# Patient Record
Sex: Female | Born: 1990 | Hispanic: Yes | Marital: Single | Smoking: Current some day smoker
Health system: Southern US, Community
[De-identification: ages and names within clinical notes are randomized; demographics above are authoritative.]

## PROBLEM LIST (undated history)

## (undated) DIAGNOSIS — F419 Anxiety disorder, unspecified: Secondary | ICD-10-CM

## (undated) DIAGNOSIS — F319 Bipolar disorder, unspecified: Secondary | ICD-10-CM

## (undated) DIAGNOSIS — M419 Scoliosis, unspecified: Secondary | ICD-10-CM

## (undated) DIAGNOSIS — J45909 Unspecified asthma, uncomplicated: Secondary | ICD-10-CM

## (undated) DIAGNOSIS — M549 Dorsalgia, unspecified: Secondary | ICD-10-CM

## (undated) DIAGNOSIS — D649 Anemia, unspecified: Secondary | ICD-10-CM

## (undated) HISTORY — DX: Bipolar disorder, unspecified: F31.9

---
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
## 2010-03-01 ENCOUNTER — Ambulatory Visit: Payer: Self-pay | Admitting: Family Medicine

## 2010-08-01 ENCOUNTER — Inpatient Hospital Stay: Payer: Self-pay | Admitting: Obstetrics and Gynecology

## 2011-05-25 ENCOUNTER — Ambulatory Visit: Payer: Self-pay | Admitting: Family Medicine

## 2011-06-04 ENCOUNTER — Encounter: Payer: Self-pay | Admitting: Family Medicine

## 2011-06-17 ENCOUNTER — Emergency Department: Payer: Self-pay | Admitting: Emergency Medicine

## 2011-07-02 ENCOUNTER — Encounter: Payer: Self-pay | Admitting: Family Medicine

## 2012-01-10 LAB — HM PAP SMEAR

## 2012-02-24 ENCOUNTER — Emergency Department: Payer: Self-pay | Admitting: Emergency Medicine

## 2012-02-24 LAB — CBC
HCT: 38.4 % (ref 35.0–47.0)
HGB: 11.9 g/dL — ABNORMAL LOW (ref 12.0–16.0)
MCH: 26 pg (ref 26.0–34.0)
MCHC: 31 g/dL — ABNORMAL LOW (ref 32.0–36.0)
MCV: 84 fL (ref 80–100)
Platelet: 165 10*3/uL (ref 150–440)
RBC: 4.58 10*6/uL (ref 3.80–5.20)
RDW: 12.6 % (ref 11.5–14.5)
WBC: 15.1 10*3/uL — ABNORMAL HIGH (ref 3.6–11.0)

## 2012-02-24 LAB — COMPREHENSIVE METABOLIC PANEL
Albumin: 3.9 g/dL (ref 3.4–5.0)
Alkaline Phosphatase: 58 U/L (ref 50–136)
Anion Gap: 6 — ABNORMAL LOW (ref 7–16)
BUN: 10 mg/dL (ref 7–18)
Bilirubin,Total: 0.6 mg/dL (ref 0.2–1.0)
Calcium, Total: 8.5 mg/dL (ref 8.5–10.1)
Chloride: 106 mmol/L (ref 98–107)
Co2: 27 mmol/L (ref 21–32)
Creatinine: 0.76 mg/dL (ref 0.60–1.30)
EGFR (African American): >60
EGFR (Non-African Amer.): >60
Glucose: 88 mg/dL (ref 65–99)
Osmolality: 276 (ref 275–301)
Potassium: 3.3 mmol/L — ABNORMAL LOW (ref 3.5–5.1)
SGOT(AST): 18 U/L (ref 15–37)
SGPT (ALT): 13 U/L (ref 12–78)
Sodium: 139 mmol/L (ref 136–145)
Total Protein: 7.9 g/dL (ref 6.4–8.2)

## 2012-02-24 LAB — URINALYSIS, COMPLETE
Bacteria: NONE SEEN
Bilirubin,UR: NEGATIVE
Glucose,UR: NEGATIVE mg/dL (ref 0–75)
Ketone: NEGATIVE
Leukocyte Esterase: NEGATIVE
Nitrite: NEGATIVE
Ph: 5 (ref 4.5–8.0)
Protein: NEGATIVE
RBC,UR: 1 /HPF (ref 0–5)
Specific Gravity: 1.03 (ref 1.003–1.030)
Squamous Epithelial: 14
WBC UR: 1 /HPF (ref 0–5)

## 2012-02-24 LAB — PREGNANCY, URINE: Pregnancy Test, Urine: NEGATIVE m[IU]/mL

## 2012-10-03 ENCOUNTER — Emergency Department: Payer: Self-pay | Admitting: Emergency Medicine

## 2012-10-03 LAB — URINALYSIS, COMPLETE
Bacteria: NONE SEEN
Bilirubin,UR: NEGATIVE
Blood: NEGATIVE
Glucose,UR: NEGATIVE mg/dL (ref 0–75)
Leukocyte Esterase: NEGATIVE
Nitrite: NEGATIVE
Ph: 7 (ref 4.5–8.0)
Protein: NEGATIVE
RBC,UR: 1 /HPF (ref 0–5)
Specific Gravity: 1.019 (ref 1.003–1.030)
Squamous Epithelial: <1
WBC UR: 1 /HPF (ref 0–5)

## 2012-10-03 LAB — CBC
HCT: 34.8 % — ABNORMAL LOW (ref 35.0–47.0)
HGB: 11.6 g/dL — ABNORMAL LOW (ref 12.0–16.0)
MCH: 27.9 pg (ref 26.0–34.0)
MCHC: 33.3 g/dL (ref 32.0–36.0)
MCV: 84 fL (ref 80–100)
Platelet: 126 10*3/uL — ABNORMAL LOW (ref 150–440)
RBC: 4.15 10*6/uL (ref 3.80–5.20)
RDW: 13.2 % (ref 11.5–14.5)
WBC: 8.5 10*3/uL (ref 3.6–11.0)

## 2012-10-03 LAB — COMPREHENSIVE METABOLIC PANEL
Albumin: 3.3 g/dL — ABNORMAL LOW (ref 3.4–5.0)
Alkaline Phosphatase: 36 U/L — ABNORMAL LOW (ref 50–136)
Anion Gap: 4 — ABNORMAL LOW (ref 7–16)
BUN: 8 mg/dL (ref 7–18)
Bilirubin,Total: 0.2 mg/dL (ref 0.2–1.0)
Calcium, Total: 8.5 mg/dL (ref 8.5–10.1)
Chloride: 106 mmol/L (ref 98–107)
Co2: 26 mmol/L (ref 21–32)
Creatinine: 0.55 mg/dL — ABNORMAL LOW (ref 0.60–1.30)
EGFR (African American): >60
EGFR (Non-African Amer.): >60
Glucose: 82 mg/dL (ref 65–99)
Osmolality: 269 (ref 275–301)
Potassium: 3.3 mmol/L — ABNORMAL LOW (ref 3.5–5.1)
SGOT(AST): 19 U/L (ref 15–37)
SGPT (ALT): 10 U/L — ABNORMAL LOW (ref 12–78)
Sodium: 136 mmol/L (ref 136–145)
Total Protein: 7.1 g/dL (ref 6.4–8.2)

## 2012-10-03 LAB — HCG, QUANTITATIVE, PREGNANCY: Beta Hcg, Quant.: 19132 m[IU]/mL — ABNORMAL HIGH

## 2012-10-23 ENCOUNTER — Emergency Department: Payer: Self-pay | Admitting: Emergency Medicine

## 2012-10-23 LAB — CBC
HCT: 34.4 % — ABNORMAL LOW (ref 35.0–47.0)
HGB: 11.5 g/dL — ABNORMAL LOW (ref 12.0–16.0)
MCH: 28.1 pg (ref 26.0–34.0)
MCHC: 33.3 g/dL (ref 32.0–36.0)
MCV: 84 fL (ref 80–100)
Platelet: 129 10*3/uL — ABNORMAL LOW (ref 150–440)
RBC: 4.08 10*6/uL (ref 3.80–5.20)
RDW: 13.1 % (ref 11.5–14.5)
WBC: 10.7 10*3/uL (ref 3.6–11.0)

## 2012-10-23 LAB — HCG, QUANTITATIVE, PREGNANCY: Beta Hcg, Quant.: 9922 m[IU]/mL — ABNORMAL HIGH

## 2012-10-23 LAB — URINALYSIS, COMPLETE
Bacteria: NONE SEEN
Bilirubin,UR: NEGATIVE
Glucose,UR: NEGATIVE mg/dL (ref 0–75)
Ketone: NEGATIVE
Nitrite: NEGATIVE
Ph: 5 (ref 4.5–8.0)
Protein: 30
RBC,UR: 11 /HPF (ref 0–5)
Specific Gravity: 1.021 (ref 1.003–1.030)
Squamous Epithelial: 3
WBC UR: 140 /HPF (ref 0–5)

## 2013-01-02 ENCOUNTER — Emergency Department: Payer: Self-pay | Admitting: Emergency Medicine

## 2013-01-02 LAB — COMPREHENSIVE METABOLIC PANEL
Albumin: 3 g/dL — ABNORMAL LOW (ref 3.4–5.0)
Alkaline Phosphatase: 67 U/L
Anion Gap: 3 — ABNORMAL LOW (ref 7–16)
BUN: 7 mg/dL (ref 7–18)
Bilirubin,Total: 0.2 mg/dL (ref 0.2–1.0)
Calcium, Total: 8.6 mg/dL (ref 8.5–10.1)
Chloride: 105 mmol/L (ref 98–107)
Co2: 27 mmol/L (ref 21–32)
Creatinine: 0.59 mg/dL — ABNORMAL LOW (ref 0.60–1.30)
EGFR (African American): >60
EGFR (Non-African Amer.): >60
Glucose: 87 mg/dL (ref 65–99)
Osmolality: 267 (ref 275–301)
Potassium: 3.5 mmol/L (ref 3.5–5.1)
SGOT(AST): 28 U/L (ref 15–37)
SGPT (ALT): 13 U/L (ref 12–78)
Sodium: 135 mmol/L — ABNORMAL LOW (ref 136–145)
Total Protein: 7.1 g/dL (ref 6.4–8.2)

## 2013-01-02 LAB — CBC WITH DIFFERENTIAL/PLATELET
Basophil #: 0 10*3/uL (ref 0.0–0.1)
Basophil %: 0.4 %
Eosinophil #: 0 10*3/uL (ref 0.0–0.7)
Eosinophil %: 0.7 %
HCT: 36.1 % (ref 35.0–47.0)
HGB: 11.8 g/dL — ABNORMAL LOW (ref 12.0–16.0)
Lymphocyte #: 1.2 10*3/uL (ref 1.0–3.6)
Lymphocyte %: 17.5 %
MCH: 27.3 pg (ref 26.0–34.0)
MCHC: 32.6 g/dL (ref 32.0–36.0)
MCV: 84 fL (ref 80–100)
Monocyte #: 0.6 x10 3/mm (ref 0.2–0.9)
Monocyte %: 9.3 %
Neutrophil #: 4.8 10*3/uL (ref 1.4–6.5)
Neutrophil %: 72.1 %
Platelet: 140 10*3/uL — ABNORMAL LOW (ref 150–440)
RBC: 4.31 10*6/uL (ref 3.80–5.20)
RDW: 13.6 % (ref 11.5–14.5)
WBC: 6.6 10*3/uL (ref 3.6–11.0)

## 2013-01-02 LAB — URINALYSIS, COMPLETE
Bacteria: NONE SEEN
Bilirubin,UR: NEGATIVE
Blood: NEGATIVE
Glucose,UR: NEGATIVE mg/dL (ref 0–75)
Nitrite: NEGATIVE
Ph: 7 (ref 4.5–8.0)
Protein: 30
RBC,UR: 1 /HPF (ref 0–5)
Specific Gravity: 1.025 (ref 1.003–1.030)
Squamous Epithelial: 5
WBC UR: 5 /HPF (ref 0–5)

## 2013-01-02 LAB — WET PREP, GENITAL

## 2013-01-02 LAB — GC/CHLAMYDIA PROBE AMP

## 2013-01-05 LAB — URINE CULTURE

## 2013-02-27 ENCOUNTER — Observation Stay: Payer: Self-pay

## 2013-03-13 ENCOUNTER — Observation Stay: Payer: Self-pay | Admitting: Obstetrics and Gynecology

## 2013-03-13 LAB — PLATELET COUNT: Platelet: 110 10*3/uL — ABNORMAL LOW (ref 150–440)

## 2013-03-14 ENCOUNTER — Inpatient Hospital Stay: Payer: Self-pay

## 2013-03-14 LAB — CBC WITH DIFFERENTIAL/PLATELET
Basophil #: 0 10*3/uL (ref 0.0–0.1)
Basophil %: 0.2 %
Eosinophil #: 0 10*3/uL (ref 0.0–0.7)
Eosinophil %: 0.1 %
HCT: 37.6 % (ref 35.0–47.0)
HGB: 11.9 g/dL — ABNORMAL LOW (ref 12.0–16.0)
Lymphocyte #: 1.1 10*3/uL (ref 1.0–3.6)
Lymphocyte %: 10.1 %
MCH: 27 pg (ref 26.0–34.0)
MCHC: 31.7 g/dL — ABNORMAL LOW (ref 32.0–36.0)
MCV: 85 fL (ref 80–100)
Monocyte #: 0.6 x10 3/mm (ref 0.2–0.9)
Monocyte %: 5.3 %
Neutrophil #: 9 10*3/uL — ABNORMAL HIGH (ref 1.4–6.5)
Neutrophil %: 84.3 %
Platelet: 116 10*3/uL — ABNORMAL LOW (ref 150–440)
RBC: 4.41 10*6/uL (ref 3.80–5.20)
RDW: 13.8 % (ref 11.5–14.5)
WBC: 10.6 10*3/uL (ref 3.6–11.0)

## 2013-03-16 LAB — HEMATOCRIT: HCT: 31.8 % — ABNORMAL LOW (ref 35.0–47.0)

## 2013-06-12 ENCOUNTER — Emergency Department: Payer: Self-pay | Admitting: Emergency Medicine

## 2013-06-24 ENCOUNTER — Emergency Department: Payer: Self-pay | Admitting: Emergency Medicine

## 2014-04-24 NOTE — Op Note (Signed)
PATIENT NAME:  Carolyn Dawson, Carolyn Dawson MR#:  [id number] DATE OF BIRTH:  1990-10-08  DATE OF PROCEDURE:  03/15/2013  PREOPERATIVE DIAGNOSES:  71. A 24 year old gravida 2, para 1-0-0-1, at 39 weeks and 6 days.  2. Failure to progress.   POSTOPERATIVE DIAGNOSES:  53. A 24 year old gravida 2, para 1-0-0-1, at 39 weeks and 6 days.  2. Failure to progress.   OPERATION PERFORMED: Primary low transverse cesarean section via Pfannenstiel skin incision.   ANESTHESIA USED: Spinal.   PRIMARY SURGEON: Florina Ou. Bonney Aid, M.D.   ASSISTANT: Elizebeth Brooking, Surgical Tech.   ESTIMATED BLOOD LOSS: 700 mL.   OPERATIVE FLUIDS: 1600 mL of crystalloid.   URINE OUTPUT: 150 mL.   DRAINS OR TUBES: Foley to gravity drainage. ON-Q catheter system.   IMPLANTS: None.   COMPLICATIONS: None.   INTRAOPERATIVE FINDINGS: Normal tubes, ovaries and uterus. Delivery resulted in the birth of a liveborn female infant weighing 3450 grams, 7 pounds 10 ounces, Apgars 8 and 9.   SPECIMENS REMOVED: None.  PATIENT CONDITION FOLLOWING PROCEDURE: Stable.   PROCEDURE IN DETAIL: Risks, benefits and alternatives of the procedure were discussed with the patient prior to proceeding to the operating room. The patient was taken to the operating room where she was placed under spinal anesthesia. She was prepped and draped in the usual sterile fashion. A timeout procedure was performed, and the level of anesthetic was checked prior to proceeding with the case. A Pfannenstiel skin incision was made 2 cm above the pubic symphysis, carried down sharply to the level of the rectus fascia using the knife. The fascia was incised in the midline using the scalpel, and then the fascial incision was extended using Mayo scissors. The superior border of the rectus fascia was grasped with 2 Kocher clamps. The underlying rectus muscles were dissected off the rectus fascia using blunt dissection. Median raphe was incised using Mayo scissors. The inferior border of  the rectus fascia was dissected off the rectus muscle in a similar fashion. The midline was identified. The peritoneum was entered bluntly, and the peritoneal incision was extended using manual traction. A bladder blade was placed. A bladder flap was then created using Metzenbaum scissors, further developed digitally before replacing the bladder blade to displace the bladder caudally. A low transverse hysterotomy incision was scored on the uterus using the knife. The hysterotomy was entered bluntly using the operator's finger and then extended using manual traction. Upon placing the operator's hand into the hysterotomy incision, the fetus was noted to be in the OA position. The vertex was grasped, flexed, brought to the incision, delivered atraumatically using fundal pressure. The infant was suctioned. Cord was clamped and cut, and the infant was passed to the awaiting pediatricians. The placenta was delivered using manual extraction. The uterus was exteriorized, wiped clean of clots and debris using 2 moist laps. The uterine incision was then closed using a 2 layer closure with the first being a running locked, the second a vertical imbricating. The uterus was returned to the abdomen. The hysterotomy incision was reinspected and noted to be hemostatic. The muscle was reapproximated in the midline using a 2-0 Vicryl mattress stitch. The ON-Q catheters were then placed subfascially per the usual protocol. Fascia was closed using a looped #1 PDS in a running fashion. The subcutaneous tissue was irrigated. Hemostasis was achieved using the Bovie. Skin was closed using staples. Each ON-Q catheter was then bolused with 5 mL of 0.5% bupivacaine each. Sponge, needle and instrument counts were correct  x 2. The patient tolerated the procedure well and was taken to the recovery room in stable condition.   ____________________________ Florina OuAndreas M. Bonney AidStaebler, MD ams:gb D: 03/17/2013 20:34:13 ET T: 03/18/2013 02:50:51  ET JOB#: [id number]  cc: Florina OuAndreas M. Bonney AidStaebler, MD, <Dictator> Carmel SacramentoANDREAS Cathrine MusterM Pryor Guettler MD ELECTRONICALLY SIGNED 03/24/2013 12:43

## 2014-05-03 ENCOUNTER — Emergency Department
Admission: EM | Admit: 2014-05-03 | Discharge: 2014-05-03 | Disposition: A | Discharge status: Home / Self Care | Payer: Medicaid Other | Attending: Emergency Medicine | Admitting: Emergency Medicine

## 2014-05-03 ENCOUNTER — Encounter: Payer: Self-pay | Admitting: Emergency Medicine

## 2014-05-03 DIAGNOSIS — Z720 Tobacco use: Secondary | ICD-10-CM | POA: Insufficient documentation

## 2014-05-03 DIAGNOSIS — R21 Rash and other nonspecific skin eruption: Secondary | ICD-10-CM | POA: Diagnosis present

## 2014-05-03 DIAGNOSIS — B86 Scabies: Secondary | ICD-10-CM | POA: Diagnosis not present

## 2014-05-03 MED ORDER — PERMETHRIN 5 % EX CREA: TOPICAL_CREAM | CUTANEOUS | Status: AC

## 2014-05-03 NOTE — Discharge Instructions (Signed)

## 2014-05-03 NOTE — ED Notes (Signed)
Pt reports that they have scabies

## 2014-05-03 NOTE — ED Notes (Signed)
States she has been treated for scabies in past   But rash and itching noted

## 2014-05-05 NOTE — ED Provider Notes (Signed)
[facility] Emergency Department Provider Note  ____________________________________________  Time seen:8:33 AM  I have reviewed the triage vital signs and the nursing notes.   HISTORY  Chief Complaint Rash    HPI Carolyn Dawson is a 24 y.o. female who presents with HER-2 children for treatment of scabies. She reports similar symptoms approximately 10 days ago. She states she as well as her children were treated however over the past 2 days of rash and itching has returned and appears to be worsening.  History reviewed. No pertinent past medical history.  There are no active problems to display for this patient.   History reviewed. No pertinent past surgical history.  Current Outpatient Rx  Name  Route  Sig  Dispense  Refill  . permethrin (ELIMITE) 5 % cream      Apply at night and wash off in the morning.   60 g   1     Allergies Review of patient's allergies indicates no known allergies.  History reviewed. No pertinent family history.  Social History History  Substance Use Topics  . Smoking status: Current Every Day Smoker  . Smokeless tobacco: Not on file  . Alcohol Use: Yes    Review of Systems  Constitutional: Negative for fever. Eyes: Negative for visual changes. ENT: Negative for sore throat. Cardiovascular: Negative for chest pain. Respiratory: Negative for shortness of breath. Gastrointestinal: Negative for abdominal pain, vomiting and diarrhea. Skin: positive for rash. 10-point ROS otherwise negative.  ____________________________________________   PHYSICAL EXAM:  VITAL SIGNS: ED Triage Vitals  Enc Vitals Group     BP 05/03/14 0802 107/61 mmHg     Pulse Rate 05/03/14 0802 81     Resp --      Temp 05/03/14 0802 98 F (36.7 C)     Temp Source 05/03/14 0802 Oral     SpO2 05/03/14 0802 98 %     Weight 05/03/14 0802 107 lb (48.535 kg)     Height 05/03/14 0802 _0  (1.575 m)     Head Cir --      Peak Flow --       Pain Score 05/03/14 0803 0     Pain Loc --      Pain Edu? --      Excl. in [place]? --     Constitutional: Alert and oriented. Well appearing and in no distress. Eyes: Conjunctivae are normal. PERRL. Normal extraocular movements. ENT   Head: Normocephalic and atraumatic.   Nose: No congestion/rhinnorhea.   Mouth/Throat: Mucous membranes are moist.   Neck: No stridor. Respiratory: Normal respiratory effort without tachypnea nor retractions. Musculoskeletal: Nontender with normal range of motion in all extremities.  Neurologic:  Normal speech and language. No gross focal neurologic deficits are appreciated. Speech is normal. No gait instability. Skin:  Skin is warm, dry and intact. Pinpoint maculopapular erythematous rash present on her upper extremities chest and back. Rash appears consistent with diagnosis of scabies. Psychiatric: Mood and affect are normal. Speech and behavior are normal. Patient exhibits appropriate insight and judgment.  ____________________________________________    LABS (pertinent positives/negatives)    ____________________________________________   EKG    ____________________________________________    RADIOLOGY    ____________________________________________   PROCEDURES  Procedure(s) performed: None  Critical Care performed: No  ____________________________________________   INITIAL IMPRESSION / ASSESSMENT AND PLAN / ED COURSE  Pertinent labs & imaging results that were available during my care of the patient were reviewed by me and considered in my  medical decision making (see chart for details).  Patient was advised to follow up with her primary care provider for symptoms that are not improving over the next 2 weeks. She was advised that the itching and the rash can last up to 3 weeks. We discussed how to disinfect her house.  ____________________________________________   FINAL CLINICAL IMPRESSION(S) / ED  DIAGNOSES  Final diagnoses:  Scabies infestation    Victorino Dike, FNP 05/05/14 2751  Carrie Mew, MD 05/06/14 [id number]

## 2014-05-11 NOTE — H&P (Signed)
L&D Evaluation:  History Expanded:  HPI 24 year old G2 P1001 with EDC=03/16/2013 by an 8 wk ultrasound presents at 39weeks 4days with c/o lost her musous plug and has something that looks like boogers coming out of her vagina. Denies ctxs, bleeding, LOF, or dysuria. Baby active. SWhe is uncomfortable and did not sleep last night, and has not eaten since 1pm today. She has GBS pos urine and the culture is POS also.   Gravida 2   Term 1   PreTerm 0   Abortion 0   Living 1   Blood Type (Maternal) A positive   Group B Strep Results Maternal (Result >5wks must be treated as unknown) positive   Maternal HIV Negative   Maternal Syphilis Ab Nonreactive   Maternal Varicella Immune   Rubella Results (Maternal) immune   Maternal T-Dap Immune   [facility]   Presents with lost mucous plug   Patient's Medical History scoliosis   Patient's Surgical History none   Medications Pre Natal Vitamins   Allergies NKDA   Social History none   Family History Non-Contributory   ROS:  ROS All systems were reviewed.  HEENT, CNS, GI, GU, Respiratory, CV, Renal and Musculoskeletal systems were found to be normal.   Exam:  Vital Signs stable  119/70   Urine Protein not completed   General no apparent distress   Mental Status clear   Chest clear   Heart normal sinus rhythm   Abdomen gravid, non-tender   Fetal Position vtx   Back no CVAT   Edema no edema   Pelvic no external lesions   Mebranes Intact   FHT normal rate with no decels, CAT 1   Ucx irregular, mild   Skin dry   Lymph no lymphadenopathy   Impression:  Impression IUP at 5539 4/7weeks with reactive NST. Not in labor   Plan:  Follow Up Appointment in 1 week   Electronic Signatures: Adria DevonKlett, Zayaan Kozak (MD)  (Signed 13-Mar-15 17:36)  Authored: L&D Evaluation   Last Updated: 13-Mar-15 17:36 by Adria DevonKlett, Sarin [place]un[place] (MD)

## 2014-05-11 NOTE — H&P (Signed)
L&D Evaluation:  History:  HPI 24 year old G2 P1001 at 3918w5d by 8wk US derived EDC of 03/16/2013 by an 8 wk ultrasound presents at 39weeks days presenting with contractions since yesterday.  No LOF, no VB, +FM   Presents with contractions   Patient's Medical History scoliosis   Patient's Surgical History none   Medications Pre Natal Vitamins   Allergies NKDA   Social History none   Family History Non-Contributory   ROS:  ROS All systems were reviewed.  HEENT, CNS, GI, GU, Respiratory, CV, Renal and Musculoskeletal systems were found to be normal.   Exam:  Vital Signs stable   Urine Protein not completed   General no apparent distress   Mental Status clear   Chest clear   Heart normal sinus rhythm   Abdomen gravid, non-tender   Fetal Position vtx   Back no CVAT   Edema no edema   Pelvic 6cm per nursing staff   Mebranes Intact   FHT normal rate with no decels, category I tracing   Ucx regular, q842min   Skin dry   Lymph no lymphadenopathy   Impression:  Impression IUP at 7239 5/7weeks presenting in active term labor   Plan:  Plan EFM/NST, monitor contractions and for cervical change, antibiotics for GBBS prophylaxis   Electronic Signatures: Lorrene ReidStaebler, Zacharius Funari M (MD)  (Signed 14-Mar-15 18:55)  Authored: L&D Evaluation   Last Updated: 14-Mar-15 18:55 by Lorrene ReidStaebler, Elius Etheredge M (MD)

## 2014-05-11 NOTE — H&P (Signed)
L&D Evaluation:  History:  HPI 24 year old G2 P1001 with EDC=03/16/2013 by an 8 wk ultrasound presents at 7137 4/7 weeks with c/o pelvic pressure. Denies ctxs, bleeding, LOF, or dysuria. Baby active. Her appt at the office was cancelled this week due to snow.   Presents with pelvic pressure   Patient's Medical History scoliosis   Patient's Surgical History none   Medications Pre Natal Vitamins   Allergies NKDA   Social History none   Family History Non-Contributory   ROS:  ROS All systems were reviewed.  HEENT, CNS, GI, GU, Respiratory, CV, Renal and Musculoskeletal systems were found to be normal.   Exam:  Vital Signs stable  119/70   Urine Protein not completed   General no apparent distress   Mental Status clear   Abdomen gravid, non-tender   Fetal Position vtx   Pelvic no external lesions, 1/30%/-1 to 0   Mebranes Intact   FHT normal rate with no decels, 130 baseline with accels to 150   Ucx irregular, mild   Impression:  Impression IUP at 37 4/7 weeks with reactive NST. Not in labor   Plan:  Plan DC home with labor precautions. RTO next week for  ROB visit.   Electronic Signatures: Trinna BalloonGutierrez, Akyla Vavrek L (CNM)  (Signed 27-Feb-15 20:47)  Authored: L&D Evaluation   Last Updated: 27-Feb-15 20:47 by Trinna BalloonGutierrez, Deannah Rossi L (CNM)

## 2014-06-23 ENCOUNTER — Encounter: Payer: Self-pay | Admitting: Emergency Medicine

## 2014-06-23 ENCOUNTER — Other Ambulatory Visit: Payer: Self-pay

## 2014-06-23 ENCOUNTER — Emergency Department
Admission: EM | Admit: 2014-06-23 | Discharge: 2014-06-23 | Disposition: A | Discharge status: Home / Self Care | Payer: Medicaid Other | Attending: Emergency Medicine | Admitting: Emergency Medicine

## 2014-06-23 DIAGNOSIS — R079 Chest pain, unspecified: Secondary | ICD-10-CM | POA: Diagnosis present

## 2014-06-23 DIAGNOSIS — F0781 Postconcussional syndrome: Secondary | ICD-10-CM | POA: Insufficient documentation

## 2014-06-23 DIAGNOSIS — G44309 Post-traumatic headache, unspecified, not intractable: Secondary | ICD-10-CM | POA: Insufficient documentation

## 2014-06-23 DIAGNOSIS — Z720 Tobacco use: Secondary | ICD-10-CM | POA: Diagnosis not present

## 2014-06-23 DIAGNOSIS — Z79899 Other long term (current) drug therapy: Secondary | ICD-10-CM | POA: Insufficient documentation

## 2014-06-23 DIAGNOSIS — F419 Anxiety disorder, unspecified: Secondary | ICD-10-CM | POA: Insufficient documentation

## 2014-06-23 HISTORY — DX: Unspecified asthma, uncomplicated: J45.909

## 2014-06-23 LAB — BASIC METABOLIC PANEL
Anion gap: 7 (ref 5–15)
BUN: 15 mg/dL (ref 6–20)
CO2: 25 mmol/L (ref 22–32)
Calcium: 9.3 mg/dL (ref 8.9–10.3)
Chloride: 106 mmol/L (ref 101–111)
Creatinine, Ser: 0.68 mg/dL (ref 0.44–1.00)
GFR calc Af Amer: >60 mL/min (ref >60)
GFR calc non Af Amer: >60 mL/min (ref >60)
Glucose, Bld: 88 mg/dL (ref 65–99)
Potassium: 3.9 mmol/L (ref 3.5–5.1)
Sodium: 138 mmol/L (ref 135–145)

## 2014-06-23 LAB — CBC
HCT: 39.7 % (ref 35.0–47.0)
Hemoglobin: 12.5 g/dL (ref 12.0–16.0)
MCH: 26.8 pg (ref 26.0–34.0)
MCHC: 31.4 g/dL — ABNORMAL LOW (ref 32.0–36.0)
MCV: 85.1 fL (ref 80.0–100.0)
Platelets: 141 10*3/uL — ABNORMAL LOW (ref 150–440)
RBC: 4.67 MIL/uL (ref 3.80–5.20)
RDW: 13.3 % (ref 11.5–14.5)
WBC: 6.8 10*3/uL (ref 3.6–11.0)

## 2014-06-23 LAB — TROPONIN I: Troponin I: <0.03 ng/mL (ref <0.031)

## 2014-06-23 MED ORDER — ONDANSETRON HCL 4 MG PO TABS: 4.0000 mg | ORAL_TABLET | Freq: Four times a day (QID) | ORAL | Status: DC | PRN

## 2014-06-23 MED ORDER — ACETAMINOPHEN 325 MG PO TABS: 650.0000 mg | ORAL_TABLET | Freq: Four times a day (QID) | ORAL | Status: DC | PRN

## 2014-06-23 MED ORDER — ACETAMINOPHEN 325 MG PO TABS
650.0000 mg | ORAL_TABLET | Freq: Once | ORAL | Status: AC
  Administered 2014-06-23: 650 mg via ORAL

## 2014-06-23 MED ORDER — ACETAMINOPHEN 325 MG PO TABS
ORAL_TABLET | ORAL | Status: AC
  Administered 2014-06-23: 650 mg via ORAL
  Filled 2014-06-23: qty 2

## 2014-06-23 NOTE — ED Notes (Signed)
Pt here with c/o intermittent midsternal chest pain without radiation since last Monday; pt reports starting new medications ( recently, headache and 1 episode of vomiting. Pt tearful in triage room while talking about family problems, pt also texting on cellphone.

## 2014-06-23 NOTE — Discharge Instructions (Signed)
Concussion °A concussion is a brain injury. It is caused by: °· A hit to the head. °· A quick and sudden movement (jolt) of the head or neck. °A concussion is usually not life threatening. Even so, it can cause serious problems. If you had a concussion before, you may have concussion-like problems after a hit to your head. °HOME CARE °General Instructions °· Follow your doctor's directions carefully. °· Take medicines only as told by your doctor. °· Only take medicines your doctor says are safe. °· Do not drink alcohol until your doctor says it is okay. Alcohol and some drugs can slow down healing. They can also put you at risk for further injury. °· If you are having trouble remembering things, write them down. °· Try to do one thing at a time if you get distracted easily. For example, do not watch TV while making dinner. °· Talk to your family members or close friends when making important decisions. °· Follow up with your doctor as told. °· Watch your symptoms. Tell others to do the same. Serious problems can sometimes happen after a concussion. Older adults are more likely to have these problems. °· Tell your teachers, school nurse, school counselor, coach, athletic trainer, or work manager about your concussion. Tell them about what you can or cannot do. They should watch to see if: °¨ It gets even harder for you to pay attention or concentrate. °¨ It gets even harder for you to remember things or learn new things. °¨ You need more time than normal to finish things. °¨ You become annoyed (irritable) more than before. °¨ You are not able to deal with stress as well. °¨ You have more problems than before. °· Rest. Make sure you: °¨ Get plenty of sleep at night. °¨ Go to sleep early. °¨ Go to bed at the same time every day. Try to wake up at the same time. °¨ Rest during the day. °¨ Take naps when you feel tired. °· Limit activities where you have to think a lot or concentrate. These include: °¨ Doing  homework. °¨ Doing work related to a job. °¨ Watching TV. °¨ Using the computer. °Returning To Your Regular Activities °Return to your normal activities slowly, not all at once. You must give your body and brain enough time to heal.  °· Do not play sports or do other athletic activities until your doctor says it is okay. °· Ask your doctor when you can drive, ride a bicycle, or work other vehicles or machines. Never do these things if you feel dizzy. °· Ask your doctor about when you can return to work or school. °Preventing Another Concussion °It is very important to avoid another brain injury, especially before you have healed. In rare cases, another injury can lead to permanent brain damage, brain swelling, or death. The risk of this is greatest during the first 7-10 days after your injury. Avoid injuries by:  °· Wearing a seat belt when riding in a car. °· Not drinking too much alcohol. °· Avoiding activities that could lead to a second concussion (such as contact sports). °· Wearing a helmet when doing activities like: °¨ Biking. °¨ Skiing. °¨ Skateboarding. °¨ Skating. °· Making your home safer by: °¨ Removing things from the floor or stairways that could make you trip. °¨ Using grab bars in bathrooms and handrails by stairs. °¨ Placing non-slip mats on floors and in bathtubs. °¨ Improve lighting in dark areas. °GET HELP IF: °· It   gets even harder for you to pay attention or concentrate. °· It gets even harder for you to remember things or learn new things. °· You need more time than normal to finish things. °· You become annoyed (irritable) more than before. °· You are not able to deal with stress as well. °· You have more problems than before. °· You have problems keeping your balance. °· You are not able to react quickly when you should. °Get help if you have any of these problems for more than 2 weeks:  °· Lasting (chronic) headaches. °· Dizziness or trouble balancing. °· Feeling sick to your stomach  (nausea). °· Seeing (vision) problems. °· Being affected by noises or light more than normal. °· Feeling sad, low, down in the dumps, blue, gloomy, or empty (depressed). °· Mood changes (mood swings). °· Feeling of fear or nervousness about what may happen (anxiety). °· Feeling annoyed. °· Memory problems. °· Problems concentrating or paying attention. °· Sleep problems. °· Feeling tired all the time. °GET HELP RIGHT AWAY IF:  °· You have bad headaches or your headaches get worse. °· You have weakness (even if it is in one hand, leg, or part of the face). °· You have loss of feeling (numbness). °· You feel off balance. °· You keep throwing up (vomiting). °· You feel tired. °· One black center of your eye (pupil) is larger than the other. °· You twitch or shake violently (convulse). °· Your speech is not clear (slurred). °· You are more confused, easily angered (agitated), or annoyed than before. °· You have more trouble resting than before. °· You are unable to recognize people or places. °· You have neck pain. °· It is difficult to wake you up. °· You have unusual behavior changes. °· You pass out (lose consciousness). °MAKE SURE YOU:  °· Understand these instructions. °· Will watch your condition. °· Will get help right away if you are not doing well or get worse. °Document Released: 12/06/2008 Document Revised: 05/04/2013 Document Reviewed: 07/10/2012 °[employer]® Patient Information ©2015 [employer]. This information is not intended to replace advice given to you by your health care provider. Make sure you discuss any questions you have with your health care provider. ° °

## 2014-06-23 NOTE — ED Notes (Signed)
AAOx3.  Skin warm and dry.  Moving all extremities equally and strong.  Ambulates with steady gait, posture relaxed.  D/C home.  NAD
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
## 2014-06-23 NOTE — ED Notes (Signed)
AAOx3.  Anxious.
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
## 2014-09-28 ENCOUNTER — Emergency Department: Payer: Medicaid Other

## 2014-09-28 ENCOUNTER — Other Ambulatory Visit: Payer: Self-pay

## 2014-09-28 ENCOUNTER — Encounter: Payer: Self-pay | Admitting: *Deleted

## 2014-09-28 ENCOUNTER — Emergency Department
Admission: EM | Admit: 2014-09-28 | Discharge: 2014-09-28 | Disposition: A | Discharge status: Home / Self Care | Payer: Medicaid Other | Attending: Emergency Medicine | Admitting: Emergency Medicine

## 2014-09-28 DIAGNOSIS — Z3202 Encounter for pregnancy test, result negative: Secondary | ICD-10-CM | POA: Diagnosis not present

## 2014-09-28 DIAGNOSIS — Z720 Tobacco use: Secondary | ICD-10-CM | POA: Insufficient documentation

## 2014-09-28 DIAGNOSIS — J45909 Unspecified asthma, uncomplicated: Secondary | ICD-10-CM | POA: Insufficient documentation

## 2014-09-28 DIAGNOSIS — R05 Cough: Secondary | ICD-10-CM | POA: Insufficient documentation

## 2014-09-28 DIAGNOSIS — R059 Cough, unspecified: Secondary | ICD-10-CM

## 2014-09-28 DIAGNOSIS — R0789 Other chest pain: Secondary | ICD-10-CM | POA: Diagnosis present

## 2014-09-28 LAB — POCT PREGNANCY, URINE: Preg Test, Ur: NEGATIVE

## 2014-09-28 MED ORDER — IBUPROFEN 600 MG PO TABS
600.0000 mg | ORAL_TABLET | Freq: Once | ORAL | Status: AC
  Administered 2014-09-28: 600 mg via ORAL
  Filled 2014-09-28: qty 1

## 2014-09-28 NOTE — Discharge Instructions (Signed)
Cough, Adult   A cough is a reflex. It helps you clear your throat and airways. A cough can help heal your body. A cough can last 2 or 3 weeks (acute) or may last more than 8 weeks (chronic). Some common causes of a cough can include an infection, allergy, or a cold.  HOME CARE  · Only take medicine as told by your doctor.  · If given, take your medicines (antibiotics) as told. Finish them even if you start to feel better.  · Use a cold steam vaporizer or humidifier in your home. This can help loosen thick spit (secretions).  · Sleep so you are almost sitting up (semi-upright). Use pillows to do this. This helps reduce coughing.  · Rest as needed.  · Stop smoking if you smoke.  GET HELP RIGHT AWAY IF:  · You have yellowish-white fluid (pus) in your thick spit.  · Your cough gets worse.  · Your medicine does not reduce coughing, and you are losing sleep.  · You cough up blood.  · You have trouble breathing.  · Your pain gets worse and medicine does not help.  · You have a fever.  MAKE SURE YOU:   · Understand these instructions.  · Will watch your condition.  · Will get help right away if you are not doing well or get worse.  Document Released: 08/31/2010 Document Revised: 05/04/2013 Document Reviewed: 08/31/2010  [employer]® Patient Information ©2015 [employer]. This information is not intended to replace advice given to you by your health care provider. Make sure you discuss any questions you have with your health care provider.

## 2014-09-28 NOTE — ED Notes (Signed)
Pt reports chest pain since last night.  States it hurts to mash on my chest.  cig smoker. Nonsmoker.  Dry cough. No fever.

## 2014-09-28 NOTE — ED Notes (Signed)
Gave crackers and PB. Ok per MD

## 2014-09-28 NOTE — ED Notes (Signed)
MD at bedside. 

## 2014-09-28 NOTE — ED Provider Notes (Signed)
[facility] Emergency Department Provider Note  ____________________________________________  Time seen: Approximately 6:48 PM  I have reviewed the triage vital signs and the nursing notes.   HISTORY  Chief Complaint Hurts to cough   HPI Carolyn Dawson is a 24 y.o. female who presents today with chest pain when she coughs.She is a smoker. She has had no recent travel, no leg swelling, she denies a first-degree relative with blood: Her grandfather may have had one. She states she has had no fever no chills no pleuritic chest pain. She states she has pain in the very reproducible spot on the left chest wall. She states it is been there for a few days. It is not hurting her at all and that she "mashes on it". Or she coughs. It hurt initially when she was coughing. No recollected trauma.  She has had no dyspnea, no nausea no vomiting and she feels otherwise quite well. She does not feel that she is having an asthma flare that she has a history of asthma. She is a smoker. No recent travel, there is no recent surgery no history of cancer she has had a depo shot    Past Medical History  Diagnosis Date  . Asthma     There are no active problems to display for this patient.   Past Surgical History  Procedure Laterality Date  . Cesarean section      Current Outpatient Rx  Name  Route  Sig  Dispense  Refill  . acetaminophen (TYLENOL) 325 MG tablet   Oral   Take 2 tablets (650 mg total) by mouth every 6 (six) hours as needed.   60 tablet   0   . ondansetron (ZOFRAN) 4 MG tablet   Oral   Take 1 tablet (4 mg total) by mouth every 6 (six) hours as needed for nausea or vomiting.   20 tablet   1   . permethrin (ELIMITE) 5 % cream      Apply at night and wash off in the morning.   60 g   1     Allergies Review of patient's allergies indicates no known allergies.  No family history on file.  Social History Social History  Substance Use Topics  . Smoking  status: Current Every Day Smoker    Types: Cigarettes  . Smokeless tobacco: None  . Alcohol Use: No    Review of Systems Constitutional: No fever/chills Eyes: No visual changes. ENT: No sore throat. No stiff neck no neck pain Cardiovascular: See history of present illness Respiratory: Denies shortness of breath. Gastrointestinal:   no vomiting.  No diarrhea.  No constipation. Genitourinary: Negative for dysuria. Musculoskeletal: Negative lower extremity swelling Skin: Negative for rash. Neurological: Negative for headaches, focal weakness or numbness. 10-point ROS otherwise negative.  ____________________________________________   PHYSICAL EXAM:  VITAL SIGNS: ED Triage Vitals  Enc Vitals Group     BP 09/28/14 1649 108/67 mmHg     Pulse Rate 09/28/14 1649 74     Resp 09/28/14 1649 18     Temp 09/28/14 1649 98.3 F (36.8 C)     Temp Source 09/28/14 1649 Oral     SpO2 09/28/14 1649 99 %     Weight --      Height --      Head Cir --      Peak Flow --      Pain Score 09/28/14 1646 5     Pain Loc --  Pain Edu? --      Excl. in GC? --     Constitutional: Alert and oriented. Well appearing and in no acute distress. Sleeping when I entered the room Eyes: Conjunctivae are normal. PERRL. EOMI. Head: Atraumatic. Nose: No congestion/rhinnorhea. Mouth/Throat: Mucous membranes are moist.  Oropharynx non-erythematous. Neck: No stridor.   Nontender with no meningismus Cardiovascular: Normal rate, regular rhythm. Grossly normal heart sounds.  Good peripheral circulation. Chest: Tender to palpation in the left chest wall, when I touch that area the patient states "ouch that's the pain right there" and pulls back. There is no obvious lesion. It is at the costochondral margin. There is no shingles there is no evidence of cellulitis there is no crepitus there is no focal chest Respiratory: Normal respiratory effort.  No retractions. Lungs CTAB. Gastrointestinal: Soft and nontender.  No distention. No abdominal bruits.  Back:  There is no focal tenderness or step off there is no midline tenderness there are no lesions noted. there is no CVA tenderness Musculoskeletal: No lower extremity tenderness. No joint effusions, no DVT signs strong distal pulses no edema Neurologic:  Normal speech and language. No gross focal neurologic deficits are appreciated.  Skin:  Skin is warm, dry and intact. No rash noted. Psychiatric: Mood and affect are normal. Speech and behavior are normal.  ____________________________________________   LABS (all labs ordered are listed, but only abnormal results are displayed)  Labs Reviewed  POC URINE PREG, ED   ____________________________________________  EKG  I have reviewed her EKG, normal sinus rhythm, no acute ST elevation no acute ST depression normal axis, short PR noted. No ischemic changes ____________________________________________  RADIOLOGY  I reviewed the x-ray ____________________________________________   PROCEDURES  Procedure(s) performed: None  Critical Care performed: None  ____________________________________________   INITIAL IMPRESSION / ASSESSMENT AND PLAN / ED COURSE  Pertinent labs & imaging results that were available during my care of the patient were reviewed by me and considered in my medical decision making (see chart for details).  Patient with very reproducible chest wall pain. This has happened in the context of cough. There is no evidence of shingles there is no evidence of crepitus or pneumothorax is no evidence of myocarditis endocarditis pericarditis or pericardial effusion. There is no evidence of pneumonia there is no evidence of acute asthmatic flare. Patient has very reproducible chest wall pain. Chest x-ray is normal. There is no evidence of ACS and I very much do not believe this is likely to be a PE. Although she does have a Depo-Provera shot, it is reproducible chest wall pain in the  context of a nonproductive cough. I do believe at this time that the risk of CT scan outweighs the benefits. I will discuss this with the patient. I have advised her to quit smoking, we will treat her pain symptomatically, and I will advise her close follow-up. Return precautions have been given and understood. ____________________________________________   FINAL CLINICAL IMPRESSION(S) / ED DIAGNOSES  Final diagnoses:  None     Jeanmarie Plant, MD 09/28/14 [phone]

## 2014-09-28 NOTE — ED Notes (Signed)
Returned from xray

## 2014-12-04 ENCOUNTER — Encounter: Payer: Self-pay | Admitting: Gynecology

## 2014-12-04 ENCOUNTER — Ambulatory Visit
Admission: EM | Admit: 2014-12-04 | Discharge: 2014-12-04 | Disposition: A | Discharge status: Home / Self Care | Payer: Medicaid Other | Attending: Family Medicine | Admitting: Family Medicine

## 2014-12-04 DIAGNOSIS — R55 Syncope and collapse: Secondary | ICD-10-CM | POA: Diagnosis not present

## 2014-12-04 DIAGNOSIS — E162 Hypoglycemia, unspecified: Secondary | ICD-10-CM | POA: Insufficient documentation

## 2014-12-04 HISTORY — DX: Anxiety disorder, unspecified: F41.9

## 2014-12-04 HISTORY — DX: Anemia, unspecified: D64.9

## 2014-12-04 LAB — URINALYSIS COMPLETE WITH MICROSCOPIC (ARMC ONLY)
Bacteria, UA: NONE SEEN
Bilirubin Urine: NEGATIVE
Glucose, UA: NEGATIVE mg/dL
Hgb urine dipstick: NEGATIVE
Ketones, ur: NEGATIVE mg/dL
Leukocytes, UA: NEGATIVE
Nitrite: NEGATIVE
Protein, ur: NEGATIVE mg/dL
Specific Gravity, Urine: 1.025 (ref 1.005–1.030)
pH: 7 (ref 5.0–8.0)

## 2014-12-04 LAB — BASIC METABOLIC PANEL
Anion gap: 6 (ref 5–15)
BUN: 12 mg/dL (ref 6–20)
CO2: 26 mmol/L (ref 22–32)
Calcium: 9 mg/dL (ref 8.9–10.3)
Chloride: 105 mmol/L (ref 101–111)
Creatinine, Ser: 0.74 mg/dL (ref 0.44–1.00)
GFR calc Af Amer: >60 mL/min (ref >60)
GFR calc non Af Amer: >60 mL/min (ref >60)
Glucose, Bld: 90 mg/dL (ref 65–99)
Potassium: 3.4 mmol/L — ABNORMAL LOW (ref 3.5–5.1)
Sodium: 137 mmol/L (ref 135–145)

## 2014-12-04 LAB — PREGNANCY, URINE: Preg Test, Ur: NEGATIVE

## 2014-12-04 LAB — CBC
HCT: 36.4 % (ref 35.0–47.0)
Hemoglobin: 11.8 g/dL — ABNORMAL LOW (ref 12.0–16.0)
MCH: 27.4 pg (ref 26.0–34.0)
MCHC: 32.5 g/dL (ref 32.0–36.0)
MCV: 84.5 fL (ref 80.0–100.0)
Platelets: 129 10*3/uL — ABNORMAL LOW (ref 150–440)
RBC: 4.31 MIL/uL (ref 3.80–5.20)
RDW: 12.6 % (ref 11.5–14.5)
WBC: 5.5 10*3/uL (ref 3.6–11.0)

## 2014-12-04 NOTE — Discharge Instructions (Signed)
Your work up was negative.  If this happens again please discuss this with your primary physician.  Take care  Dr. Adriana Simasook

## 2014-12-04 NOTE — ED Notes (Signed)
Patient stated while at work yesterday fainted and nurse checked BG at  Work was 58 given something eat and recheck was 68. Patient stated history of anemia and stated feeling tired today. Pt. Denies dizziness / nausea today.

## 2014-12-04 NOTE — ED Provider Notes (Signed)
CSN: [id number]     Arrival date & time 12/04/14  1407 History   First MD Initiated Contact with Patient 12/04/14 1440     Chief Complaint  Patient presents with  . Near Syncope   (Consider location/radiation/quality/duration/timing/severity/associated sxs/prior Treatment) HPI  24 year old female presents to Urgent care for evaluation after having a syncopal episode yesterday morning at work.    Patient states that she ate a large meal earlier in the morning at work. She subsequently passed out per her coworkers reports. She was evaluated by the nurse at her work who found her to have a low blood sugar of 58 which improved quickly with administration of a sugary drink. She quickly improved and did fine afterwards. She states that she felt as if she was going to pass out but did not have any preceding nausea. Since that time she has felt fine. She states that she comes in for evaluation today as was recommended by her nurse. She has a history of anemia and the nurse was concerned that she may be diabetic.  Of note, when she fell she did injure her left arm. There is some bruising present. She states that this is minor and not really a problem for her.  Prior to her syncopal episode she states that she's been good health. She reports that she has a history of anemia and states that her periods are normal. She states that she does not feel like she could be currently pregnant as she is on Depo-Provera and her and her partner use condoms. No recent medication changes. She reports marijuana use occasionally but denies any other drugs. No other known potential inciting factors. She has passed out previously.   Past Medical History  Diagnosis Date  . Asthma   . Anxiety   . Anemia    Past Surgical History  Procedure Laterality Date  . Cesarean section     Family History  Problem Relation Age of Onset  . Diabetes Father     Social History  Substance Use Topics  . Smoking status: Current  Every Day Smoker    Types: Cigarettes  . Smokeless tobacco: None  . Alcohol Use: No   OB History    No data available     Review of Systems  Constitutional: Negative.   Neurological: Positive for syncope.  All other systems reviewed and are negative.   Allergies  Review of patient's allergies indicates no known allergies.  Home Medications   Prior to Admission medications   Medication Sig Start Date End Date Taking? Authorizing Provider  acetaminophen (TYLENOL) 325 MG tablet Take 2 tablets (650 mg total) by mouth every 6 (six) hours as needed. 06/23/14  Yes Sharman CheekPhillip Stafford, MD  alprazolam Prudy Feeler(XANAX) 2 MG tablet Take 2 mg by mouth at bedtime as needed for sleep.   Yes Historical Provider, MD  Prenatal Multivit-Min-Fe-FA (PRENATAL VITAMINS PO) Take by mouth.   Yes Historical Provider, MD  ondansetron (ZOFRAN) 4 MG tablet Take 1 tablet (4 mg total) by mouth every 6 (six) hours as needed for nausea or vomiting. 06/23/14   Sharman CheekPhillip Stafford, MD  permethrin (ELIMITE) 5 % cream Apply at night and wash off in the morning. 05/03/14 05/03/15  Chinita Pesterari B Triplett, FNP   Meds Ordered and Administered this Visit  Medications - No data to display  BP 105/59 mmHg  Pulse 80  Temp(Src) 97.7 F (36.5 C) (Tympanic)  Resp 16  Ht 5\' 2"  (1.575 m)  Wt 120 lb (  54.432 kg)  BMI 21.94 kg/m2  SpO2 100%  LMP 11/10/2014 Orthostatic VS for the past 24 hrs:  BP- Lying Pulse- Lying BP- Sitting Pulse- Sitting BP- Standing at 0 minutes Pulse- Standing at 0 minutes  12/04/14 1509 103/50 mmHg 71 109/60 mmHg 72 106/58 mmHg 73   Physical Exam  Constitutional: She is oriented to person, place, and time. She appears well-developed and well-nourished. No distress.  HENT:  Head: Normocephalic and atraumatic.  Nose: Nose normal.  Mouth/Throat: Oropharynx is clear and moist. No oropharyngeal exudate.  Normal TM's bilaterally.   Eyes: Conjunctivae and EOM are normal. No scleral icterus.  Neck: Neck supple.   Cardiovascular: Normal rate and regular rhythm.   No murmur heard. Pulmonary/Chest: Effort normal and breath sounds normal. She has no wheezes. She has no rales.  Abdominal: Soft. She exhibits no distension. There is no tenderness. There is no rebound and no guarding.  Musculoskeletal: Normal range of motion. She exhibits no edema.  Neurological: She is alert and oriented to person, place, and time.  No focal deficits.  Skin: Skin is warm and dry. No rash noted.  Psychiatric: She has a normal mood and affect.  Vitals reviewed.   ED Course  Procedures (including critical care time)  Labs Review Labs Reviewed  URINALYSIS COMPLETEWITH MICROSCOPIC ([facility] ONLY) - Abnormal; Notable for the following:    Color, Urine AMBER (*)    Squamous Epithelial / LPF 0-5 (*)    All other components within normal limits  CBC - Abnormal; Notable for the following:    Hemoglobin 11.8 (*)    Platelets 129 (*)    All other components within normal limits  BASIC METABOLIC PANEL - Abnormal; Notable for the following:    Potassium 3.4 (*)    All other components within normal limits  PREGNANCY, URINE  HEMOGLOBIN A1C   MDM   1. Syncope, unspecified syncope type   2. Hypoglycemia    24 year old female presents after suffering a recent syncopal episode. New problem. Etiology is unclear at this time. Differential diagnosis: Vasovagal syncope, arrhythmia, underlying cardiac/pulmonary structural disease, seizure, Orthostasis, pyschogenic. I feel that the hypoglycemia that was found was likely spurious. Her physical exam is unremarkable today. No evidence of arrhythmia or coronary pulmonary disease. History is not suggestive of seizure.  Laboratory workup was also unremarkable. It did reveal mild anemia and thrombocytopenia (nonspecific). This is most likely vasovagal symptom syncope given her history of passing out, her age, and her normal exam and laboratory findings today. Advised close follow-up with  PCP.     Tommie Sams, DO 12/04/14 [phone]

## 2014-12-05 LAB — HEMOGLOBIN A1C: Hgb A1c MFr Bld: 4.7 % (ref 4.0–6.0)

## 2014-12-06 ENCOUNTER — Telehealth: Payer: Self-pay | Admitting: Family Medicine

## 2014-12-06 NOTE — Telephone Encounter (Signed)
Left voicemail indicating normal A1C.

## 2015-01-04 IMAGING — CR RIGHT HAND - COMPLETE 3+ VIEW
1 series · 3 of 3 positions shown · non-contrast
Comparison: None.

CLINICAL DATA: Injured hand.

EXAM:
RIGHT HAND - COMPLETE 3+ VIEW

[Series 1: pa · 0.17mm/px · 3 of 3 slices shown]
[im 1/3]
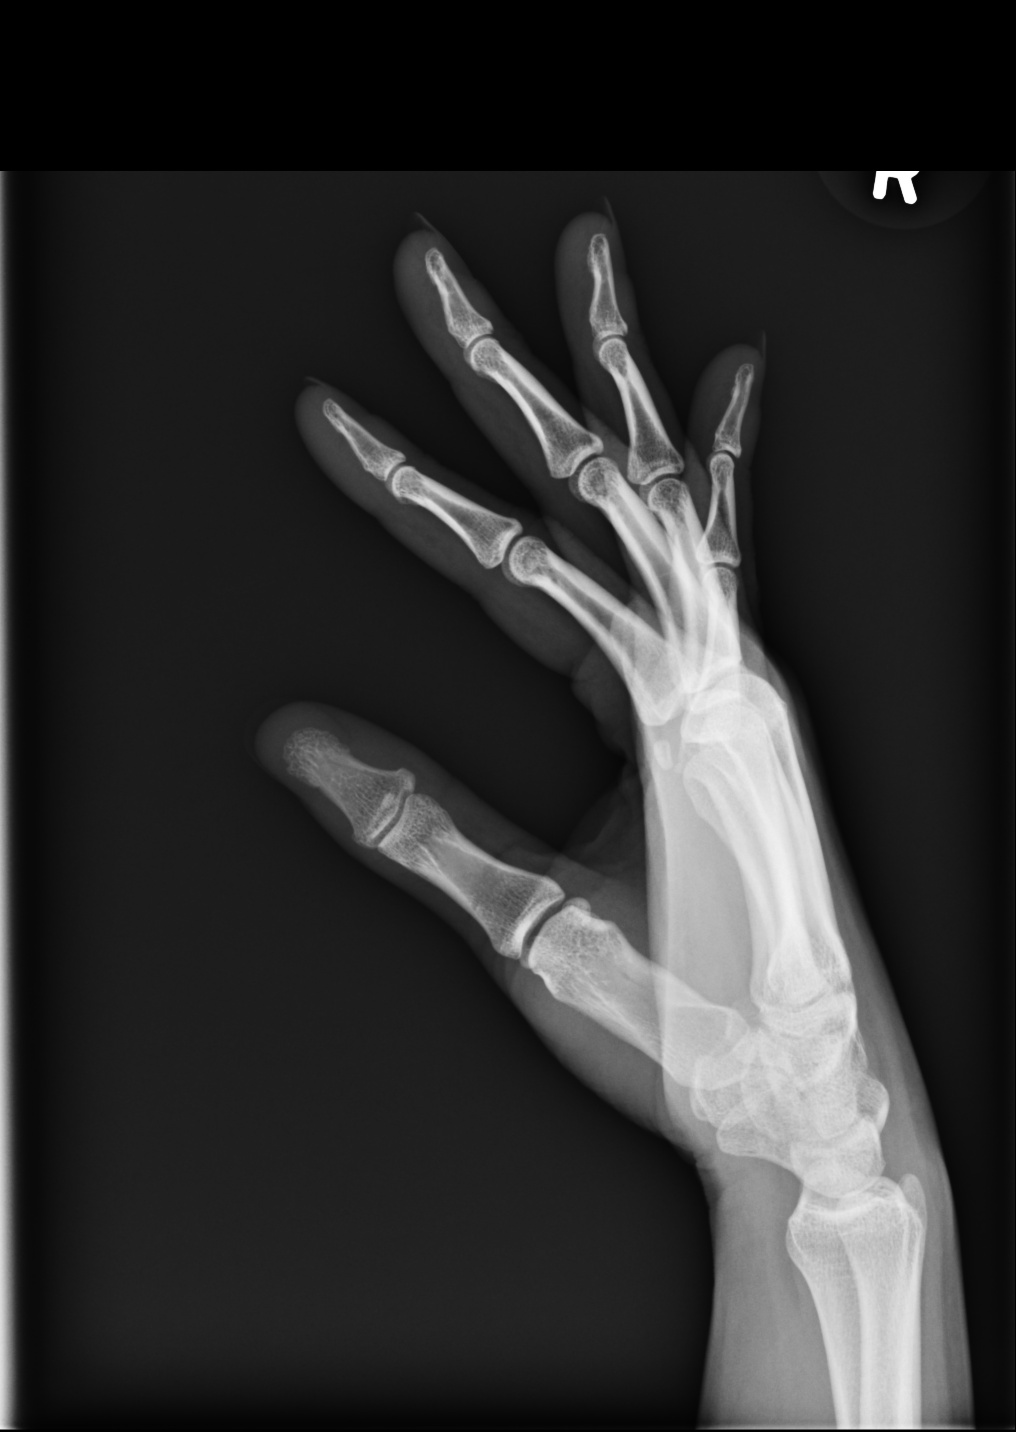
[im 2/3]
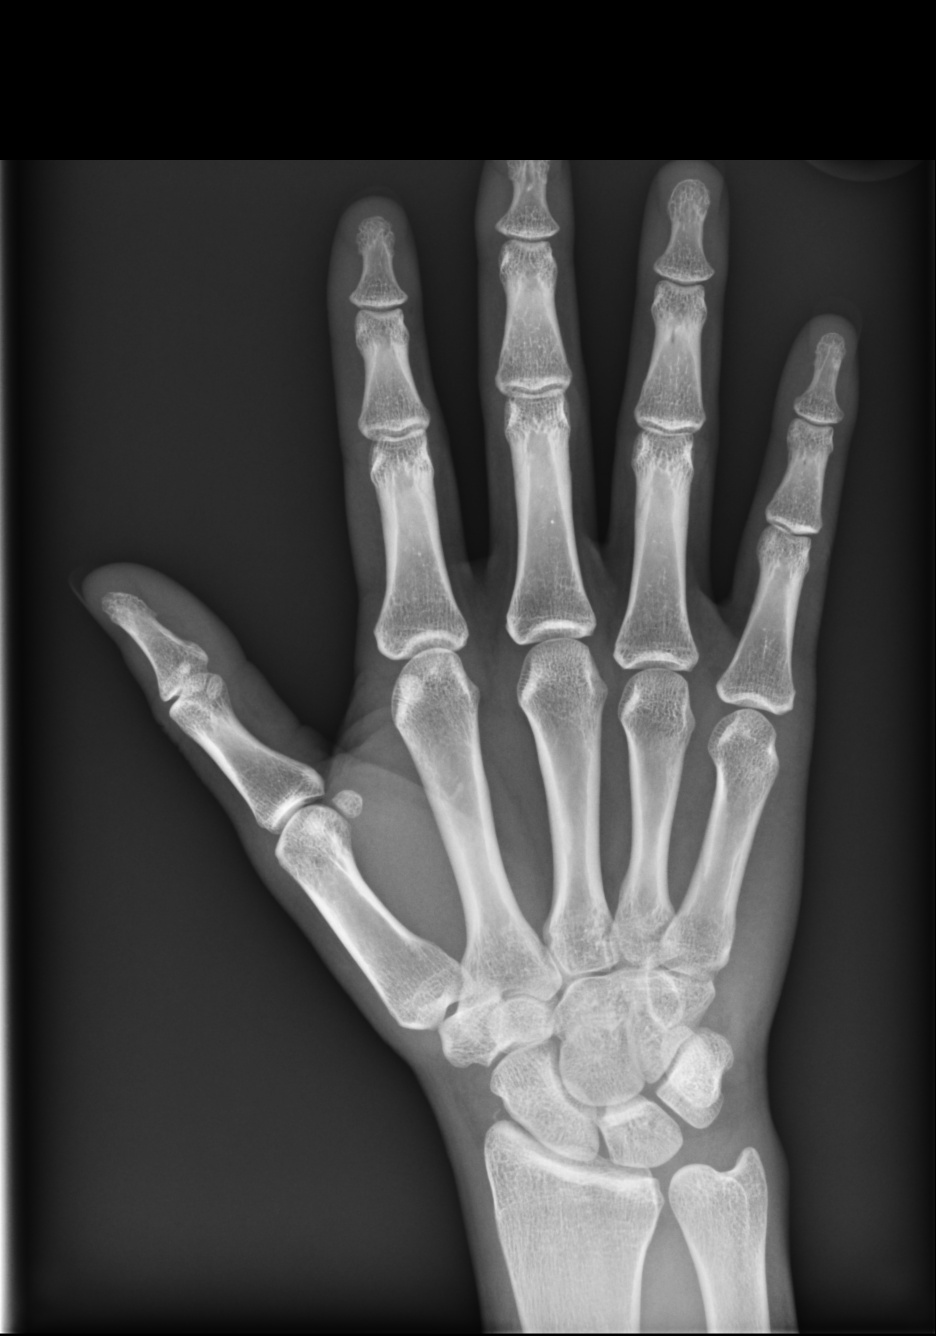
[im 3/3]
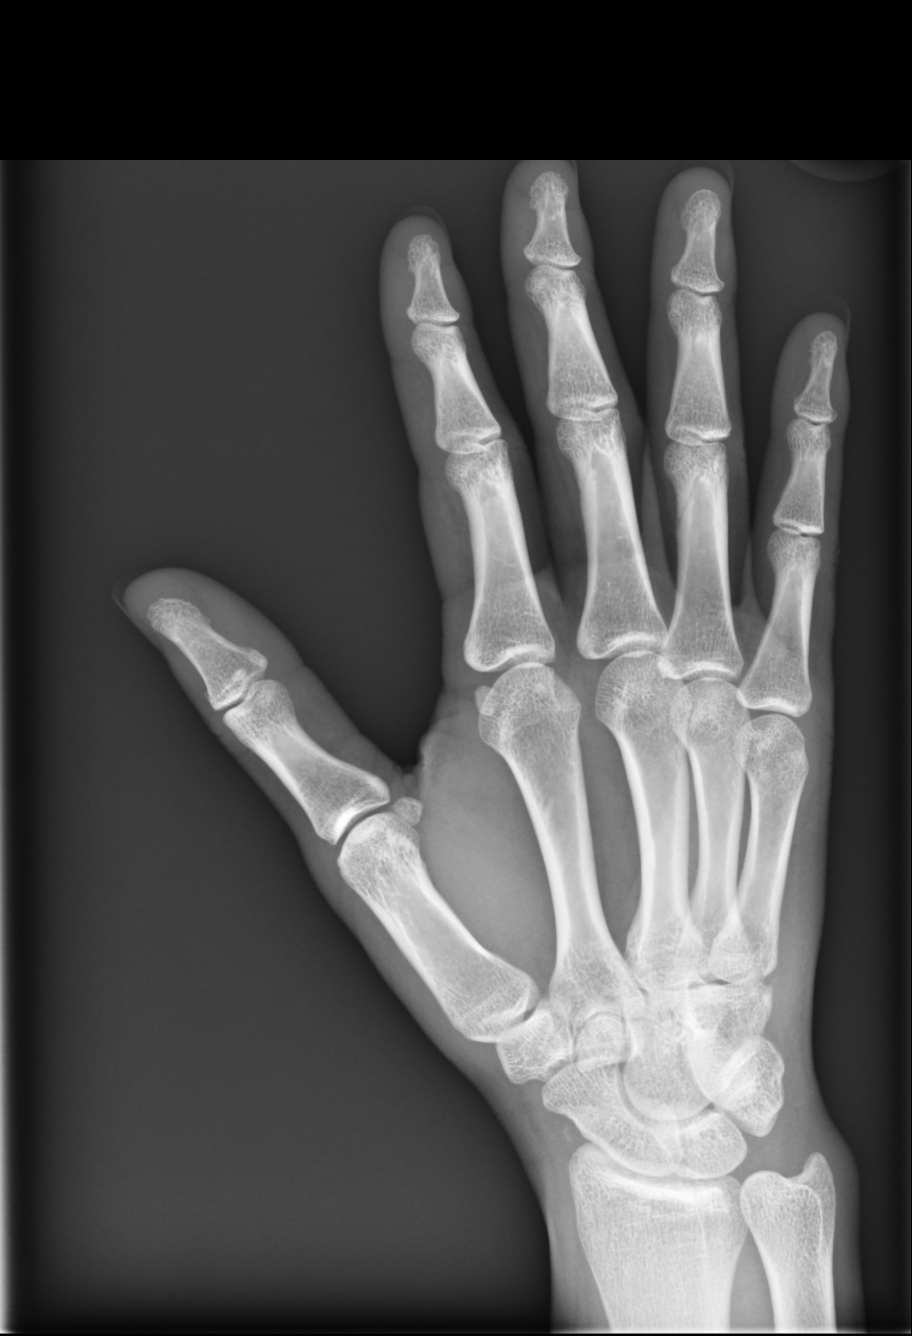

[3 of 3 positions shown; findings below may reference images not displayed]

FINDINGS: The joint spaces are maintained. No acute fractures identified. No
degenerative changes.
IMPRESSION: No acute bony findings.

## 2015-05-19 ENCOUNTER — Encounter: Payer: Self-pay | Admitting: *Deleted

## 2015-05-19 ENCOUNTER — Emergency Department
Admission: EM | Admit: 2015-05-19 | Discharge: 2015-05-19 | Disposition: A | Discharge status: Home / Self Care | Payer: No Typology Code available for payment source | Attending: Student | Admitting: Student

## 2015-05-19 ENCOUNTER — Emergency Department: Payer: No Typology Code available for payment source

## 2015-05-19 DIAGNOSIS — J45909 Unspecified asthma, uncomplicated: Secondary | ICD-10-CM | POA: Insufficient documentation

## 2015-05-19 DIAGNOSIS — S9031XA Contusion of right foot, initial encounter: Secondary | ICD-10-CM

## 2015-05-19 DIAGNOSIS — Y939 Activity, unspecified: Secondary | ICD-10-CM | POA: Diagnosis not present

## 2015-05-19 DIAGNOSIS — F17210 Nicotine dependence, cigarettes, uncomplicated: Secondary | ICD-10-CM | POA: Insufficient documentation

## 2015-05-19 DIAGNOSIS — M545 Low back pain, unspecified: Secondary | ICD-10-CM

## 2015-05-19 DIAGNOSIS — S99921A Unspecified injury of right foot, initial encounter: Secondary | ICD-10-CM | POA: Diagnosis present

## 2015-05-19 DIAGNOSIS — Y92410 Unspecified street and highway as the place of occurrence of the external cause: Secondary | ICD-10-CM | POA: Diagnosis not present

## 2015-05-19 DIAGNOSIS — Y999 Unspecified external cause status: Secondary | ICD-10-CM | POA: Diagnosis not present

## 2015-05-19 LAB — POCT PREGNANCY, URINE: Preg Test, Ur: NEGATIVE

## 2015-05-19 MED ORDER — NAPROXEN 500 MG PO TABS: 500.0000 mg | ORAL_TABLET | Freq: Two times a day (BID) | ORAL | Status: DC

## 2015-05-19 MED ORDER — CYCLOBENZAPRINE HCL 5 MG PO TABS: 5.0000 mg | ORAL_TABLET | Freq: Three times a day (TID) | ORAL | Status: DC | PRN
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
## 2015-05-19 NOTE — Discharge Instructions (Signed)
Back Pain, Adult °Back pain is very common in adults. The cause of back pain is rarely dangerous and the pain often gets better over time. The cause of your back pain may not be known. Some common causes of back pain include: °· Strain of the muscles or ligaments supporting the spine. °· Wear and tear (degeneration) of the spinal disks. °· Arthritis. °· Direct injury to the back. °For many people, back pain may return. Since back pain is rarely dangerous, most people can learn to manage this condition on their own. °HOME CARE INSTRUCTIONS °Watch your back pain for any changes. The following actions may help to lessen any discomfort you are feeling: °· Remain active. It is stressful on your back to sit or stand in one place for long periods of time. Do not sit, drive, or stand in one place for more than 30 minutes at a time. Take short walks on even surfaces as soon as you are able. Try to increase the length of time you walk each day. °· Exercise regularly as directed by your health care provider. Exercise helps your back heal faster. It also helps avoid future injury by keeping your muscles strong and flexible. °· Do not stay in bed. Resting more than 1-2 days can delay your recovery. °· Pay attention to your body when you bend and lift. The most comfortable positions are those that put less stress on your recovering back. Always use proper lifting techniques, including: °· Bending your knees. °· Keeping the load close to your body. °· Avoiding twisting. °· Find a comfortable position to sleep. Use a firm mattress and lie on your side with your knees slightly bent. If you lie on your back, put a pillow under your knees. °· Avoid feeling anxious or stressed. Stress increases muscle tension and can worsen back pain. It is important to recognize when you are anxious or stressed and learn ways to manage it, such as with exercise. °· Take medicines only as directed by your health care provider. Over-the-counter  medicines to reduce pain and inflammation are often the most helpful. Your health care provider may prescribe muscle relaxant drugs. These medicines help dull your pain so you can more quickly return to your normal activities and healthy exercise. °· Apply ice to the injured area: °· Put ice in a plastic bag. °· Place a towel between your skin and the bag. °· Leave the ice on for 20 minutes, 2-3 times a day for the first 2-3 days. After that, ice and heat may be alternated to reduce pain and spasms. °· Maintain a healthy weight. Excess weight puts extra stress on your back and makes it difficult to maintain good posture. °SEEK MEDICAL CARE IF: °· You have pain that is not relieved with rest or medicine. °· You have increasing pain going down into the legs or buttocks. °· You have pain that does not improve in one week. °· You have night pain. °· You lose weight. °· You have a fever or chills. °SEEK IMMEDIATE MEDICAL CARE IF:  °· You develop new bowel or bladder control problems. °· You have unusual weakness or numbness in your arms or legs. °· You develop nausea or vomiting. °· You develop abdominal pain. °· You feel faint. °  °This information is not intended to replace advice given to you by your health care provider. Make sure you discuss any questions you have with your health care provider. °  °Document Released: 12/18/2004 Document Revised: 01/08/2014 Document Reviewed: 04/21/2013 °Elsevier Interactive Patient Education ©2016 Elsevier   Inc.  Contusion A contusion is a deep bruise. Contusions happen when an injury causes bleeding under the skin. Symptoms of bruising include pain, swelling, and discolored skin. The skin may turn blue, purple, or yellow. HOME CARE   Rest the injured area.  If told, put ice on the injured area.  Put ice in a plastic bag.  Place a towel between your skin and the bag.  Leave the ice on for 20 minutes, 2-3 times per day.  If told, put light pressure (compression) on  the injured area using an elastic bandage. Make sure the bandage is not too tight. Remove it and put it back on as told by your doctor.  If possible, raise (elevate) the injured area above the level of your heart while you are sitting or lying down.  Take over-the-counter and prescription medicines only as told by your doctor. GET HELP IF:  Your symptoms do not get better after several days of treatment.  Your symptoms get worse.  You have trouble moving the injured area. GET HELP RIGHT AWAY IF:   You have very bad pain.  You have a loss of feeling (numbness) in a hand or foot.  Your hand or foot turns pale or cold.   This information is not intended to replace advice given to you by your health care provider. Make sure you discuss any questions you have with your health care provider.   Document Released: 06/06/2007 Document Revised: 09/08/2014 Document Reviewed: 05/05/2014 Elsevier Interactive Patient Education 2016 Elsevier Inc.  Cryotherapy Cryotherapy is when you put ice on your injury. Ice helps lessen pain and puffiness (swelling) after an injury. Ice works the best when you start using it in the first 24 to 48 hours after an injury. HOME CARE  Put a dry or damp towel between the ice pack and your skin.  You may press gently on the ice pack.  Leave the ice on for no more than 10 to 20 minutes at a time.  Check your skin after 5 minutes to make sure your skin is okay.  Rest at least 20 minutes between ice pack uses.  Stop using ice when your skin loses feeling (numbness).  Do not use ice on someone who cannot tell you when it hurts. This includes small children and people with memory problems (dementia). GET HELP RIGHT AWAY IF:  You have white spots on your skin.  Your skin turns blue or pale.  Your skin feels waxy or hard.  Your puffiness gets worse. MAKE SURE YOU:   Understand these instructions.  Will watch your condition.  Will get help right away  if you are not doing well or get worse.   This information is not intended to replace advice given to you by your health care provider. Make sure you discuss any questions you have with your health care provider.   Document Released: 06/06/2007 Document Revised: 03/12/2011 Document Reviewed: 08/10/2010 Elsevier Interactive Patient Education 2016 [employer].  Tourist information centre manager It is common to have multiple bruises and sore muscles after a motor vehicle collision (MVC). These tend to feel worse for the first 24 hours. You may have the most stiffness and soreness over the first several hours. You may also feel worse when you wake up the first morning after your collision. After this point, you will usually begin to improve with each day. The speed of improvement often depends on the severity of the collision, the number of injuries, and the location  and nature of these injuries. HOME CARE INSTRUCTIONS  Put ice on the injured area.  Put ice in a plastic bag.  Place a towel between your skin and the bag.  Leave the ice on for 15-20 minutes, 3-4 times a day, or as directed by your health care provider.  Drink enough fluids to keep your urine clear or pale yellow. Do not drink alcohol.  Take a warm shower or bath once or twice a day. This will increase blood flow to sore muscles.  You may return to activities as directed by your caregiver. Be careful when lifting, as this may aggravate neck or back pain.  Only take over-the-counter or prescription medicines for pain, discomfort, or fever as directed by your caregiver. Do not use aspirin. This may increase bruising and bleeding. SEEK IMMEDIATE MEDICAL CARE IF:  You have numbness, tingling, or weakness in the arms or legs.  You develop severe headaches not relieved with medicine.  You have severe neck pain, especially tenderness in the middle of the back of your neck.  You have changes in bowel or bladder control.  There is  increasing pain in any area of the body.  You have shortness of breath, light-headedness, dizziness, or fainting.  You have chest pain.  You feel sick to your stomach (nauseous), throw up (vomit), or sweat.  You have increasing abdominal discomfort.  There is blood in your urine, stool, or vomit.  You have pain in your shoulder (shoulder strap areas).  You feel your symptoms are getting worse. MAKE SURE YOU:  Understand these instructions.  Will watch your condition.  Will get help right away if you are not doing well or get worse.   This information is not intended to replace advice given to you by your health care provider. Make sure you discuss any questions you have with your health care provider.   Document Released: 12/18/2004 Document Revised: 01/08/2014 Document Reviewed: 05/17/2010 Elsevier Interactive Patient Education [employer] Elsevier Inc.

## 2015-09-27 ENCOUNTER — Emergency Department
Admission: EM | Admit: 2015-09-27 | Discharge: 2015-09-27 | Disposition: A | Discharge status: Home / Self Care | Payer: Medicaid Other | Attending: Emergency Medicine | Admitting: Emergency Medicine

## 2015-09-27 ENCOUNTER — Encounter: Payer: Self-pay | Admitting: Emergency Medicine

## 2015-09-27 DIAGNOSIS — Z13828 Encounter for screening for other musculoskeletal disorder: Secondary | ICD-10-CM

## 2015-09-27 DIAGNOSIS — J45909 Unspecified asthma, uncomplicated: Secondary | ICD-10-CM | POA: Diagnosis not present

## 2015-09-27 DIAGNOSIS — M549 Dorsalgia, unspecified: Secondary | ICD-10-CM | POA: Diagnosis present

## 2015-09-27 DIAGNOSIS — Z791 Long term (current) use of non-steroidal anti-inflammatories (NSAID): Secondary | ICD-10-CM | POA: Insufficient documentation

## 2015-09-27 DIAGNOSIS — M546 Pain in thoracic spine: Secondary | ICD-10-CM | POA: Insufficient documentation

## 2015-09-27 DIAGNOSIS — F17210 Nicotine dependence, cigarettes, uncomplicated: Secondary | ICD-10-CM | POA: Diagnosis not present

## 2015-09-27 MED ORDER — METHOCARBAMOL 750 MG PO TABS: 750.0000 mg | ORAL_TABLET | Freq: Four times a day (QID) | ORAL | 0 refills | Status: DC

## 2015-09-27 MED ORDER — TRAMADOL HCL 50 MG PO TABS: 50.0000 mg | ORAL_TABLET | Freq: Four times a day (QID) | ORAL | 0 refills | Status: DC | PRN

## 2015-09-27 NOTE — ED Provider Notes (Signed)
[facility] Emergency Department Provider Note   ____________________________________________   None    (approximate)  I have reviewed the triage vital signs and the nursing notes.   HISTORY  Chief Complaint Back Pain    HPI Carolyn Dawson is a 25 y.o. female patient complaining separation a back pain. Patient has dextroscoliosis which has increased. Patient states she's been followed by a chiropractor who states that he cannot offer any more therapy. Patient has not discussed her condition with a primary care chills routinely. Patient stated pain is worsening the past weeks. Patient state yesterday more acute. Patient states she contacted her PCP if she cannot see him until October 9. Patient denies any radicular component to her pain. Patient denies any bladder or bowel dysfunction. No palliative measures for her complaint.  Past Medical History:  Diagnosis Date  . Anemia   . Anxiety   . Asthma     There are no active problems to display for this patient.   Past Surgical History:  Procedure Laterality Date  . CESAREAN SECTION      Prior to Admission medications   Medication Sig Start Date End Date Taking? Authorizing Provider  alprazolam Prudy Feeler) 2 MG tablet Take 2 mg by mouth at bedtime as needed for sleep.    Historical Provider, MD  cyclobenzaprine (FLEXERIL) 5 MG tablet Take 1 tablet (5 mg total) by mouth every 8 (eight) hours as needed for muscle spasms. 05/19/15   Jami L Hagler, PA-C  methocarbamol (ROBAXIN-750) 750 MG tablet Take 1 tablet (750 mg total) by mouth 4 (four) times daily. 09/27/15   Joni Reining, PA-C  naproxen (NAPROSYN) 500 MG tablet Take 1 tablet (500 mg total) by mouth 2 (two) times daily with a meal. 05/19/15   Jami L Hagler, PA-C  traMADol (ULTRAM) 50 MG tablet Take 1 tablet (50 mg total) by mouth every 6 (six) hours as needed. 09/27/15 09/26/16  Joni Reining, PA-C    Allergies Review of patient's allergies indicates no  known allergies.  Family History  Problem Relation Age of Onset  . Diabetes Father     Social History Social History  Substance Use Topics  . Smoking status: Current Every Day Smoker    Types: Cigarettes  . Smokeless tobacco: Never Used  . Alcohol use No    Review of Systems Constitutional: No fever/chills Eyes: No visual changes. ENT: No sore throat. Cardiovascular: Denies chest pain. Respiratory: Denies shortness of breath. Gastrointestinal: No abdominal pain.  No nausea, no vomiting.  No diarrhea.  No constipation. Genitourinary: Negative for dysuria. Musculoskeletal:Upper back pain  Skin: Negative for rash. Neurological: Negative for headaches, focal weakness or numbness. {**Psychiatric:Anxiety ____________________________________________   PHYSICAL EXAM:  VITAL SIGNS: ED Triage Vitals  Enc Vitals Group     BP 09/27/15 0855 100/69     Pulse Rate 09/27/15 0855 73     Resp 09/27/15 0855 20     Temp 09/27/15 0855 98.2 F (36.8 C)     Temp Source 09/27/15 0855 Oral     SpO2 09/27/15 0855 98 %     Weight 09/27/15 0854 112 lb (50.8 kg)     Height 09/27/15 0854 5\' 2"  (1.575 m)     Head Circumference --      Peak Flow --      Pain Score --      Pain Loc --      Pain Edu? --      Excl. in GC? --  Constitutional: Alert and oriented. Well appearing and in no acute distress. Eyes: Conjunctivae are normal. PERRL. EOMI. Head: Atraumatic. Nose: No congestion/rhinnorhea. Mouth/Throat: Mucous membranes are moist.  Oropharynx non-erythematous. Neck: No stridor.  No cervical spine tenderness to palpation. Hematological/Lymphatic/Immunilogical: No cervical lymphadenopathy. Cardiovascular: Normal rate, regular rhythm. Grossly normal heart sounds.  Good peripheral circulation. Respiratory: Normal respiratory effort.  No retractions. Lungs CTAB. Gastrointestinal: Soft and nontender. No distention. No abdominal bruits. No CVA tenderness. Musculoskeletal: No obvious  deformity of the right the lumbar spine. Patient has some moderate guarding T12-L2. Patient decreased range of motion with flexion extension secondary to complain of pain. Patient also had bilateral spinal muscle spasms with lateral movements. Patient straight leg test.  Skin is warm, dry and intact. No rash noted. Psychiatric: Mood and affect are normal. Speech and behavior are normal.  ____________________________________________   LABS (all labs ordered are listed, but only abnormal results are displayed)  Labs Reviewed - No data to display ____________________________________________  EKG   ____________________________________________  RADIOLOGY   ____________________________________________   PROCEDURES  Procedure(s) performed: None  Procedures  Critical Care performed: No  ____________________________________________   INITIAL IMPRESSION / ASSESSMENT AND PLAN / ED COURSE  Pertinent labs & imaging results that were available during my care of the patient were reviewed by me and considered in my medical decision making (see chart for details).  Upper back pain. Patient given discharge Instructions. Patient advised to follow-up family doctor to consider consult orthopedics. Patient given a prescription for tramadol and Robaxin. Patient given a work note.  Clinical Course     ____________________________________________   FINAL CLINICAL IMPRESSION(S) / ED DIAGNOSES  Final diagnoses:  Scoliosis concern      NEW MEDICATIONS STARTED DURING THIS VISIT:  New Prescriptions   METHOCARBAMOL (ROBAXIN-750) 750 MG TABLET    Take 1 tablet (750 mg total) by mouth 4 (four) times daily.   TRAMADOL (ULTRAM) 50 MG TABLET    Take 1 tablet (50 mg total) by mouth every 6 (six) hours as needed.     Note:  This document was prepared using Dragon voice recognition software and may include unintentional dictation errors.    Joni Reiningonald K Kandice Schmelter, PA-C 09/27/15 0940    Minna AntisKevin  Paduchowski, MD 09/27/15 1524

## 2015-09-27 NOTE — Discharge Instructions (Signed)
Advised to follow-up family doctor consider orthopedic consult.

## 2015-09-27 NOTE — ED Notes (Signed)
States she has a hx of scoliosis and is having increased pain to entire back   Has been taking naprosyn w/o relief  States pain does not radiate into legs  Ambulates well to treatment room

## 2015-09-27 NOTE — ED Triage Notes (Signed)
Reports lower back pain for weeks, worse yesterday.  Ambulates well.

## 2016-01-27 ENCOUNTER — Emergency Department
Admission: EM | Admit: 2016-01-27 | Discharge: 2016-01-27 | Disposition: A | Discharge status: Home / Self Care | Payer: Medicaid Other | Attending: Emergency Medicine | Admitting: Emergency Medicine

## 2016-01-27 ENCOUNTER — Encounter: Payer: Self-pay | Admitting: Emergency Medicine

## 2016-01-27 DIAGNOSIS — R197 Diarrhea, unspecified: Secondary | ICD-10-CM | POA: Diagnosis not present

## 2016-01-27 DIAGNOSIS — J45909 Unspecified asthma, uncomplicated: Secondary | ICD-10-CM | POA: Insufficient documentation

## 2016-01-27 DIAGNOSIS — Z791 Long term (current) use of non-steroidal anti-inflammatories (NSAID): Secondary | ICD-10-CM | POA: Diagnosis not present

## 2016-01-27 DIAGNOSIS — Z79899 Other long term (current) drug therapy: Secondary | ICD-10-CM | POA: Insufficient documentation

## 2016-01-27 DIAGNOSIS — F17210 Nicotine dependence, cigarettes, uncomplicated: Secondary | ICD-10-CM | POA: Diagnosis not present

## 2016-01-27 DIAGNOSIS — R112 Nausea with vomiting, unspecified: Secondary | ICD-10-CM | POA: Diagnosis present

## 2016-01-27 LAB — COMPREHENSIVE METABOLIC PANEL
ALT: 12 U/L — ABNORMAL LOW (ref 14–54)
AST: 20 U/L (ref 15–41)
Albumin: 4.3 g/dL (ref 3.5–5.0)
Alkaline Phosphatase: 29 U/L — ABNORMAL LOW (ref 38–126)
Anion gap: 6 (ref 5–15)
BUN: 15 mg/dL (ref 6–20)
CO2: 27 mmol/L (ref 22–32)
Calcium: 9.1 mg/dL (ref 8.9–10.3)
Chloride: 104 mmol/L (ref 101–111)
Creatinine, Ser: 0.66 mg/dL (ref 0.44–1.00)
GFR calc Af Amer: >60 mL/min (ref >60)
GFR calc non Af Amer: >60 mL/min (ref >60)
Glucose, Bld: 90 mg/dL (ref 65–99)
Potassium: 3.6 mmol/L (ref 3.5–5.1)
Sodium: 137 mmol/L (ref 135–145)
Total Bilirubin: 0.8 mg/dL (ref 0.3–1.2)
Total Protein: 7.8 g/dL (ref 6.5–8.1)

## 2016-01-27 LAB — URINALYSIS, COMPLETE (UACMP) WITH MICROSCOPIC
Bacteria, UA: NONE SEEN
Bilirubin Urine: NEGATIVE
Glucose, UA: NEGATIVE mg/dL
Hgb urine dipstick: NEGATIVE
Ketones, ur: 20 mg/dL — AB
Leukocytes, UA: NEGATIVE
Nitrite: NEGATIVE
Protein, ur: 30 mg/dL — AB
Specific Gravity, Urine: 1.026 (ref 1.005–1.030)
pH: 5 (ref 5.0–8.0)

## 2016-01-27 LAB — LIPASE, BLOOD: Lipase: 30 U/L (ref 11–51)

## 2016-01-27 LAB — CBC
HCT: 41.4 % (ref 35.0–47.0)
Hemoglobin: 13.2 g/dL (ref 12.0–16.0)
MCH: 27 pg (ref 26.0–34.0)
MCHC: 31.9 g/dL — ABNORMAL LOW (ref 32.0–36.0)
MCV: 84.7 fL (ref 80.0–100.0)
Platelets: 168 10*3/uL (ref 150–440)
RBC: 4.89 MIL/uL (ref 3.80–5.20)
RDW: 12.5 % (ref 11.5–14.5)
WBC: 13.7 10*3/uL — ABNORMAL HIGH (ref 3.6–11.0)

## 2016-01-27 LAB — POCT PREGNANCY, URINE: Preg Test, Ur: NEGATIVE

## 2016-01-27 MED ORDER — SODIUM CHLORIDE 0.9 % IV BOLUS (SEPSIS)
1000.0000 mL | Freq: Once | INTRAVENOUS | Status: AC
  Administered 2016-01-27: 1000 mL via INTRAVENOUS

## 2016-01-27 MED ORDER — ONDANSETRON HCL 4 MG/2ML IJ SOLN
4.0000 mg | Freq: Once | INTRAMUSCULAR | Status: AC
  Administered 2016-01-27: 4 mg via INTRAVENOUS
  Filled 2016-01-27: qty 2

## 2016-01-27 MED ORDER — ONDANSETRON HCL 4 MG PO TABS: 4.0000 mg | ORAL_TABLET | Freq: Three times a day (TID) | ORAL | 0 refills | Status: DC | PRN

## 2016-01-27 MED ORDER — DICYCLOMINE HCL 20 MG PO TABS: 20.0000 mg | ORAL_TABLET | Freq: Three times a day (TID) | ORAL | 0 refills | Status: DC | PRN

## 2016-01-27 NOTE — ED Triage Notes (Signed)
Vomiting and diarrhea started today 

## 2016-01-27 NOTE — ED Provider Notes (Signed)
Time Seen: Approximately 2053  I have reviewed the triage notes  Chief Complaint: Emesis and Diarrhea   History of Present Illness: Carolyn Dawson is a 26 y.o. female presents with acute onset of nausea, vomiting and diarrhea. She states it started earlier this evening. She states she vomited multiple times and was unable to tolerate any food or fluid intake. She denies any hematemesis or bloody diarrhea. She denies any recent antibiotic therapy or travel. She is not aware of any foodborne exposures   Past Medical History:  Diagnosis Date  . Anemia   . Anxiety   . Asthma     There are no active problems to display for this patient.   Past Surgical History:  Procedure Laterality Date  . CESAREAN SECTION      Past Surgical History:  Procedure Laterality Date  . CESAREAN SECTION      Current Outpatient Rx  . Order #: [id number] Class: Historical Med  . Order #: [id number] Class: Print  . Order #: [id number] Class: Print  . Order #: [id number] Class: Print  . Order #: [id number] Class: Print  . Order #: [id number] Class: Print  . Order #: [id number] Class: Print    Allergies:  Patient has no known allergies.  Family History: Family History  Problem Relation Age of Onset  . Diabetes Father     Social History: Social History  Substance Use Topics  . Smoking status: Current Every Day Smoker    Types: Cigarettes  . Smokeless tobacco: Never Used  . Alcohol use No     Review of Systems:   10 point review of systems was performed and was otherwise negative:  Constitutional: No fever Eyes: No visual disturbances ENT: No sore throat, ear pain Cardiac: No chest pain Respiratory: No shortness of breath, wheezing, or stridor Abdomen: No Focal abdominal pain, Endocrine: No weight loss, No night sweats Extremities: No peripheral edema, cyanosis Skin: No rashes, easy bruising Neurologic: No focal weakness, trouble with speech or swollowing Urologic: No dysuria, Hematuria, or  urinary frequency   Physical Exam:  ED Triage Vitals  Enc Vitals Group     BP 01/27/16 1928 110/60     Pulse Rate 01/27/16 1928 (!) 113     Resp 01/27/16 1928 16     Temp 01/27/16 1928 98.7 F (37.1 C)     Temp Source 01/27/16 1928 Oral     SpO2 01/27/16 1928 99 %     Weight 01/27/16 1929 115 lb (52.2 kg)     Height 01/27/16 1929 5\' 2"  (1.575 m)     Head Circumference --      Peak Flow --      Pain Score 01/27/16 1929 0     Pain Loc --      Pain Edu? --      Excl. in GC? --     General: Awake , Alert , and Oriented times 3; GCS 15 Head: Normal cephalic , atraumatic Eyes: Pupils equal , round, reactive to light Nose/Throat: No nasal drainage, patent upper airway without erythema or exudate.  Neck: Supple, Full range of motion, No anterior adenopathy or palpable thyroid masses Lungs: Clear to ascultation without wheezes , rhonchi, or rales Heart: Regular rate, regular rhythm without murmurs , gallops , or rubs Abdomen: Soft, non tender without rebound, guarding , or rigidity; bowel sounds positive and symmetric in all 4 quadrants. No organomegaly .        Extremities: 2 plus symmetric pulses. No edema, clubbing or cyanosis Neurologic: normal  ambulation, Motor symmetric without deficits, sensory intact Skin: warm, dry, no rashes   Labs:   All laboratory work was reviewed including any pertinent negatives or positives listed below:  Labs Reviewed  COMPREHENSIVE METABOLIC PANEL - Abnormal; Notable for the following:       Result Value   ALT 12 (*)    Alkaline Phosphatase 29 (*)    All other components within normal limits  CBC - Abnormal; Notable for the following:    WBC 13.7 (*)    MCHC 31.9 (*)    All other components within normal limits  URINALYSIS, COMPLETE (UACMP) WITH MICROSCOPIC - Abnormal; Notable for the following:    Color, Urine YELLOW (*)    APPearance CLEAR (*)    Ketones, ur 20 (*)    Protein, ur 30 (*)    Squamous Epithelial / LPF 0-5 (*)    All  other components within normal limits  LIPASE, BLOOD  POC URINE PREG, ED  POCT PREGNANCY, URINE     ED Course: Patient's stay here was uneventful and she was started on IV fluids along with Zofran, etc. patient's stay here was uneventful and she felt symptomatically improved. I felt given her current clinical presentation and objective findings this most likely was viral gastroenteritis with frequency of the norovirus in the area. The patient was advised to drink plenty of fluids and return here especially if she develops a high fever, focal abdominal pain, bloody diarrhea, or any other new concerns     Assessment Viral gastroenteritis   Final Clinical Impression:   Final diagnoses:  Nausea vomiting and diarrhea     Plan:  Outpatient " New Prescriptions   DICYCLOMINE (BENTYL) 20 MG TABLET    Take 1 tablet (20 mg total) by mouth 3 (three) times daily as needed for spasms.   ONDANSETRON (ZOFRAN) 4 MG TABLET    Take 1 tablet (4 mg total) by mouth every 8 (eight) hours as needed for nausea or vomiting.  "  Patient was advised to return immediately if condition worsens. Patient was advised to follow up with their primary care physician or other specialized physicians involved in their outpatient care. The patient and/or family member/power of attorney had laboratory results reviewed at the bedside. All questions and concerns were addressed and appropriate discharge instructions were distributed by the nursing staff.             Jennye Moccasin, MD 01/27/16 2204

## 2016-01-27 NOTE — Discharge Instructions (Signed)
Please advance her diet as tolerated. Please drink sports drinks like Gatorade, etc. Return to emergency department especially for a high fever, focal abdominal pain (pain in one location), bloody diarrhea, bloody vomit, etc.  Please return immediately if condition worsens. Please contact her primary physician or the physician you were given for referral. If you have any specialist physicians involved in her treatment and plan please also contact them. Thank you for using [facility].

## 2016-07-10 ENCOUNTER — Emergency Department
Admission: EM | Admit: 2016-07-10 | Discharge: 2016-07-10 | Disposition: A | Discharge status: Home / Self Care | Payer: Medicaid Other | Attending: Emergency Medicine | Admitting: Emergency Medicine

## 2016-07-10 ENCOUNTER — Encounter: Payer: Self-pay | Admitting: Emergency Medicine

## 2016-07-10 DIAGNOSIS — M546 Pain in thoracic spine: Secondary | ICD-10-CM | POA: Insufficient documentation

## 2016-07-10 DIAGNOSIS — J45909 Unspecified asthma, uncomplicated: Secondary | ICD-10-CM | POA: Diagnosis not present

## 2016-07-10 DIAGNOSIS — G8929 Other chronic pain: Secondary | ICD-10-CM

## 2016-07-10 DIAGNOSIS — M545 Low back pain: Secondary | ICD-10-CM | POA: Diagnosis present

## 2016-07-10 DIAGNOSIS — F17210 Nicotine dependence, cigarettes, uncomplicated: Secondary | ICD-10-CM | POA: Insufficient documentation

## 2016-07-10 HISTORY — DX: Dorsalgia, unspecified: M54.9

## 2016-07-10 LAB — URINALYSIS, COMPLETE (UACMP) WITH MICROSCOPIC
Bacteria, UA: NONE SEEN
Bilirubin Urine: NEGATIVE
Glucose, UA: NEGATIVE mg/dL
Hgb urine dipstick: NEGATIVE
Ketones, ur: NEGATIVE mg/dL
Leukocytes, UA: NEGATIVE
Nitrite: NEGATIVE
Protein, ur: NEGATIVE mg/dL
Specific Gravity, Urine: 1.026 (ref 1.005–1.030)
pH: 7 (ref 5.0–8.0)

## 2016-07-10 LAB — POCT PREGNANCY, URINE: Preg Test, Ur: NEGATIVE

## 2016-07-10 MED ORDER — KETOROLAC TROMETHAMINE 30 MG/ML IJ SOLN
30.0000 mg | Freq: Once | INTRAMUSCULAR | Status: AC
  Administered 2016-07-10: 30 mg via INTRAMUSCULAR
  Filled 2016-07-10: qty 1
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
## 2016-07-10 NOTE — ED Notes (Signed)
Pt states pain is better now, but doesn't want it to get worse again. Pt ambulated with steady gait to waiting room.
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
## 2016-07-10 NOTE — Discharge Instructions (Signed)
Follow-up with your primary care provider if any continued problems. Continue taking methocarbamol daily for the next 3 days. Take tramadol as needed for pain as prescribed by your primary care provider. Use ice or heat to your back as needed for comfort. Today you were given an injection of Toradol for your back pain.
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
## 2016-07-25 ENCOUNTER — Ambulatory Visit
Admission: RE | Admit: 2016-07-25 | Discharge: 2016-07-25 | Disposition: A | Discharge status: Home / Self Care | Payer: Disability Insurance | Source: Ambulatory Visit | Attending: Dentistry | Admitting: Dentistry

## 2016-07-25 ENCOUNTER — Other Ambulatory Visit: Payer: Self-pay | Admitting: Dentistry

## 2016-07-25 DIAGNOSIS — M549 Dorsalgia, unspecified: Secondary | ICD-10-CM

## 2016-10-23 ENCOUNTER — Encounter: Payer: Self-pay | Admitting: Emergency Medicine

## 2016-10-23 DIAGNOSIS — Z5321 Procedure and treatment not carried out due to patient leaving prior to being seen by health care provider: Secondary | ICD-10-CM | POA: Diagnosis not present

## 2016-10-23 DIAGNOSIS — M549 Dorsalgia, unspecified: Secondary | ICD-10-CM | POA: Diagnosis present

## 2016-10-23 LAB — POCT PREGNANCY, URINE: Preg Test, Ur: NEGATIVE
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
## 2016-10-24 ENCOUNTER — Emergency Department
Admission: EM | Admit: 2016-10-24 | Discharge: 2016-10-24 | Disposition: A | Discharge status: Home / Self Care | Payer: No Typology Code available for payment source | Attending: Emergency Medicine | Admitting: Emergency Medicine

## 2016-10-24 HISTORY — DX: Scoliosis, unspecified: M41.9

## 2017-01-22 ENCOUNTER — Emergency Department
Admission: EM | Admit: 2017-01-22 | Discharge: 2017-01-22 | Disposition: A | Discharge status: Home / Self Care | Payer: Medicaid Other | Attending: Emergency Medicine | Admitting: Emergency Medicine

## 2017-01-22 ENCOUNTER — Encounter: Payer: Self-pay | Admitting: Emergency Medicine

## 2017-01-22 ENCOUNTER — Emergency Department: Payer: Medicaid Other

## 2017-01-22 ENCOUNTER — Other Ambulatory Visit: Payer: Self-pay

## 2017-01-22 DIAGNOSIS — R509 Fever, unspecified: Secondary | ICD-10-CM | POA: Diagnosis present

## 2017-01-22 DIAGNOSIS — J111 Influenza due to unidentified influenza virus with other respiratory manifestations: Secondary | ICD-10-CM

## 2017-01-22 DIAGNOSIS — J45909 Unspecified asthma, uncomplicated: Secondary | ICD-10-CM | POA: Insufficient documentation

## 2017-01-22 DIAGNOSIS — J09X9 Influenza due to identified novel influenza A virus with other manifestations: Secondary | ICD-10-CM | POA: Insufficient documentation

## 2017-01-22 DIAGNOSIS — F17210 Nicotine dependence, cigarettes, uncomplicated: Secondary | ICD-10-CM | POA: Insufficient documentation

## 2017-01-22 LAB — INFLUENZA PANEL BY PCR (TYPE A & B)
Influenza A By PCR: POSITIVE — AB
Influenza B By PCR: NEGATIVE

## 2017-01-22 MED ORDER — ACETAMINOPHEN-CODEINE #3 300-30 MG PO TABS: 1.0000 | ORAL_TABLET | Freq: Three times a day (TID) | ORAL | 0 refills | Status: AC | PRN

## 2017-01-22 MED ORDER — ONDANSETRON 4 MG PO TBDP
4.0000 mg | ORAL_TABLET | Freq: Once | ORAL | Status: AC
  Administered 2017-01-22: 4 mg via ORAL
  Filled 2017-01-22: qty 1

## 2017-01-22 MED ORDER — ACETAMINOPHEN 325 MG PO TABS
ORAL_TABLET | ORAL | Status: AC
  Filled 2017-01-22: qty 2

## 2017-01-22 MED ORDER — ACETAMINOPHEN 325 MG PO TABS
650.0000 mg | ORAL_TABLET | Freq: Once | ORAL | Status: AC
  Administered 2017-01-22: 650 mg via ORAL

## 2017-01-22 MED ORDER — BENZONATATE 100 MG PO CAPS: 100.0000 mg | ORAL_CAPSULE | Freq: Three times a day (TID) | ORAL | 0 refills | Status: DC | PRN

## 2017-01-22 MED ORDER — ONDANSETRON 4 MG PO TBDP: 4.0000 mg | ORAL_TABLET | Freq: Three times a day (TID) | ORAL | 0 refills | Status: DC | PRN

## 2017-01-22 MED ORDER — OSELTAMIVIR PHOSPHATE 75 MG PO CAPS: 75.0000 mg | ORAL_CAPSULE | Freq: Two times a day (BID) | ORAL | 0 refills | Status: AC
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
## 2017-01-22 NOTE — Discharge Instructions (Signed)
You have tested POSITIVE for the flu (influenza). Take the prescription meds as directed. Drink plenty of fluids to prevent dehydration. Follow-up with your provider for continued symptoms. Return to the ED as needed.

## 2017-01-22 NOTE — ED Notes (Addendum)
Presents with body aches and fever for couple of days  Febrile on arrival  sxs' started yesterday   States had similar episodes about 1-2 weeks ago  Having headache body aches ,cough    States she has also had some vomiting

## 2017-01-22 NOTE — ED Triage Notes (Signed)
Pt with chills, fever and body aches.

## 2017-05-07 ENCOUNTER — Encounter: Payer: Self-pay | Admitting: Emergency Medicine

## 2017-05-07 ENCOUNTER — Emergency Department: Payer: Medicaid Other

## 2017-05-07 ENCOUNTER — Emergency Department
Admission: EM | Admit: 2017-05-07 | Discharge: 2017-05-07 | Disposition: A | Discharge status: Home / Self Care | Payer: Medicaid Other | Attending: Emergency Medicine | Admitting: Emergency Medicine

## 2017-05-07 ENCOUNTER — Other Ambulatory Visit: Payer: Self-pay

## 2017-05-07 DIAGNOSIS — R1310 Dysphagia, unspecified: Secondary | ICD-10-CM | POA: Diagnosis not present

## 2017-05-07 DIAGNOSIS — Z5321 Procedure and treatment not carried out due to patient leaving prior to being seen by health care provider: Secondary | ICD-10-CM | POA: Insufficient documentation

## 2017-05-07 NOTE — ED Notes (Signed)
Pt called by this tech and RN Sam x4 times with no response, this tech walked outside to see if pt could be found and another pt told this tech that pt just left

## 2017-05-07 NOTE — ED Triage Notes (Signed)
Pt presents to ED c/o difficulty swallowing x2 days. Pt speaking with clear voice, no resp distress noted. Pt reports certain foods (e.g. meats) make symptoms worse. Pt denies vomiting after eating, denies pain. Able to swallow secretions without difficulty.

## 2018-01-01 NOTE — L&D Delivery Note (Signed)
Obstetrical Delivery Note   Date of Delivery:   12/08/2018 Primary OB:   [facility] Gestational Age/EDD: [redacted]w[redacted]d (Dated by LMP) Antepartum complications: Previous Cesarean section  Delivered By:   Dalia Heading, CNM  Delivery Type:   spontaneous vaginal delivery / VBAC Procedure Details:   Mother pushed to deliver a vigorous female infant on ROA over intact perineum. Nuchal cord x 1 reduced on the perineum, There was also a left nuchal hand. Baby, bulb suctioned,  dried and placed on mother's abdomen. After a delayed cord clamping, the father of the baby cut the cord. Spontaneous delivery of intact placenta and 3 vessel cord. No cervical, vaginal, or perineal lacerations.  Anesthesia:    epidural Intrapartum complications: None GBS:    negative Laceration:    none Episiotomy:    none Placenta:    Via active 3rd stage. To pathology: no Estimated Blood Loss:  400 ml Baby:    Liveborn female, Apgars 9/9, weight 6#10.4oz    Dalia Heading, CNM

## 2018-04-02 ENCOUNTER — Other Ambulatory Visit: Payer: Self-pay

## 2018-04-02 ENCOUNTER — Emergency Department
Admission: EM | Admit: 2018-04-02 | Discharge: 2018-04-02 | Disposition: A | Discharge status: Home / Self Care | Payer: Medicaid Other | Attending: Emergency Medicine | Admitting: Emergency Medicine

## 2018-04-02 ENCOUNTER — Encounter: Payer: Self-pay | Admitting: Emergency Medicine

## 2018-04-02 DIAGNOSIS — R112 Nausea with vomiting, unspecified: Secondary | ICD-10-CM | POA: Insufficient documentation

## 2018-04-02 DIAGNOSIS — F17210 Nicotine dependence, cigarettes, uncomplicated: Secondary | ICD-10-CM | POA: Diagnosis not present

## 2018-04-02 DIAGNOSIS — Z79899 Other long term (current) drug therapy: Secondary | ICD-10-CM | POA: Diagnosis not present

## 2018-04-02 LAB — CBC WITH DIFFERENTIAL/PLATELET
Abs Immature Granulocytes: 0.01 10*3/uL (ref 0.00–0.07)
Basophils Absolute: 0.1 10*3/uL (ref 0.0–0.1)
Basophils Relative: 1 %
Eosinophils Absolute: 0.2 10*3/uL (ref 0.0–0.5)
Eosinophils Relative: 5 %
HCT: 39 % (ref 36.0–46.0)
Hemoglobin: 12.5 g/dL (ref 12.0–15.0)
Immature Granulocytes: 0 %
Lymphocytes Relative: 39 %
Lymphs Abs: 1.7 10*3/uL (ref 0.7–4.0)
MCH: 28 pg (ref 26.0–34.0)
MCHC: 32.1 g/dL (ref 30.0–36.0)
MCV: 87.2 fL (ref 80.0–100.0)
Monocytes Absolute: 0.5 10*3/uL (ref 0.1–1.0)
Monocytes Relative: 10 %
Neutro Abs: 1.9 10*3/uL (ref 1.7–7.7)
Neutrophils Relative %: 45 %
Platelets: 156 10*3/uL (ref 150–400)
RBC: 4.47 MIL/uL (ref 3.87–5.11)
RDW: 12.4 % (ref 11.5–15.5)
WBC: 4.3 10*3/uL (ref 4.0–10.5)
nRBC: 0 % (ref 0.0–0.2)

## 2018-04-02 LAB — POCT PREGNANCY, URINE: Preg Test, Ur: NEGATIVE

## 2018-04-02 LAB — COMPREHENSIVE METABOLIC PANEL
ALT: 12 U/L (ref 0–44)
AST: 20 U/L (ref 15–41)
Albumin: 4.3 g/dL (ref 3.5–5.0)
Alkaline Phosphatase: 24 U/L — ABNORMAL LOW (ref 38–126)
Anion gap: 8 (ref 5–15)
BUN: 15 mg/dL (ref 6–20)
CO2: 22 mmol/L (ref 22–32)
Calcium: 8.6 mg/dL — ABNORMAL LOW (ref 8.9–10.3)
Chloride: 105 mmol/L (ref 98–111)
Creatinine, Ser: 0.66 mg/dL (ref 0.44–1.00)
GFR calc Af Amer: >60 mL/min (ref >60)
GFR calc non Af Amer: >60 mL/min (ref >60)
Glucose, Bld: 80 mg/dL (ref 70–99)
Potassium: 3.8 mmol/L (ref 3.5–5.1)
Sodium: 135 mmol/L (ref 135–145)
Total Bilirubin: 1 mg/dL (ref 0.3–1.2)
Total Protein: 7.6 g/dL (ref 6.5–8.1)

## 2018-04-02 LAB — LIPASE, BLOOD: Lipase: 22 U/L (ref 11–51)

## 2018-04-02 MED ORDER — ONDANSETRON 4 MG PO TBDP: 4.0000 mg | ORAL_TABLET | Freq: Three times a day (TID) | ORAL | 0 refills | Status: DC | PRN
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
## 2018-04-02 NOTE — ED Triage Notes (Signed)
Pt in via POV with complaints of intermittent emesis since Friday.  Ambulatory to triage, NAD noted at this time.

## 2018-04-10 ENCOUNTER — Emergency Department: Payer: Medicaid Other

## 2018-04-10 ENCOUNTER — Encounter: Payer: Self-pay | Admitting: Emergency Medicine

## 2018-04-10 ENCOUNTER — Other Ambulatory Visit: Payer: Self-pay

## 2018-04-10 ENCOUNTER — Emergency Department
Admission: EM | Admit: 2018-04-10 | Discharge: 2018-04-10 | Disposition: A | Discharge status: Home / Self Care | Payer: Medicaid Other | Attending: Emergency Medicine | Admitting: Emergency Medicine

## 2018-04-10 DIAGNOSIS — Z3A01 Less than 8 weeks gestation of pregnancy: Secondary | ICD-10-CM | POA: Insufficient documentation

## 2018-04-10 DIAGNOSIS — O99511 Diseases of the respiratory system complicating pregnancy, first trimester: Secondary | ICD-10-CM | POA: Insufficient documentation

## 2018-04-10 DIAGNOSIS — Z79899 Other long term (current) drug therapy: Secondary | ICD-10-CM | POA: Insufficient documentation

## 2018-04-10 DIAGNOSIS — O99331 Smoking (tobacco) complicating pregnancy, first trimester: Secondary | ICD-10-CM | POA: Diagnosis not present

## 2018-04-10 DIAGNOSIS — J45909 Unspecified asthma, uncomplicated: Secondary | ICD-10-CM | POA: Diagnosis not present

## 2018-04-10 DIAGNOSIS — O219 Vomiting of pregnancy, unspecified: Secondary | ICD-10-CM | POA: Diagnosis present

## 2018-04-10 DIAGNOSIS — Z3491 Encounter for supervision of normal pregnancy, unspecified, first trimester: Secondary | ICD-10-CM

## 2018-04-10 DIAGNOSIS — F17210 Nicotine dependence, cigarettes, uncomplicated: Secondary | ICD-10-CM | POA: Diagnosis not present

## 2018-04-10 LAB — URINALYSIS, COMPLETE (UACMP) WITH MICROSCOPIC
Bacteria, UA: NONE SEEN
Bilirubin Urine: NEGATIVE
Glucose, UA: NEGATIVE mg/dL
Hgb urine dipstick: NEGATIVE
Ketones, ur: 5 mg/dL — AB
Leukocytes,Ua: NEGATIVE
Nitrite: NEGATIVE
Protein, ur: 30 mg/dL — AB
Specific Gravity, Urine: 1.026 (ref 1.005–1.030)
pH: 6 (ref 5.0–8.0)

## 2018-04-10 LAB — COMPREHENSIVE METABOLIC PANEL
ALT: 11 U/L (ref 0–44)
AST: 17 U/L (ref 15–41)
Albumin: 4.1 g/dL (ref 3.5–5.0)
Alkaline Phosphatase: 22 U/L — ABNORMAL LOW (ref 38–126)
Anion gap: 10 (ref 5–15)
BUN: 10 mg/dL (ref 6–20)
CO2: 22 mmol/L (ref 22–32)
Calcium: 9 mg/dL (ref 8.9–10.3)
Chloride: 107 mmol/L (ref 98–111)
Creatinine, Ser: 0.66 mg/dL (ref 0.44–1.00)
GFR calc Af Amer: >60 mL/min (ref >60)
GFR calc non Af Amer: >60 mL/min (ref >60)
Glucose, Bld: 90 mg/dL (ref 70–99)
Potassium: 3.3 mmol/L — ABNORMAL LOW (ref 3.5–5.1)
Sodium: 139 mmol/L (ref 135–145)
Total Bilirubin: 0.8 mg/dL (ref 0.3–1.2)
Total Protein: 6.7 g/dL (ref 6.5–8.1)

## 2018-04-10 LAB — CBC WITH DIFFERENTIAL/PLATELET
Abs Immature Granulocytes: 0 10*3/uL (ref 0.00–0.07)
Basophils Absolute: 0.1 10*3/uL (ref 0.0–0.1)
Basophils Relative: 1 %
Eosinophils Absolute: 0.3 10*3/uL (ref 0.0–0.5)
Eosinophils Relative: 5 %
HCT: 34.3 % — ABNORMAL LOW (ref 36.0–46.0)
Hemoglobin: 11 g/dL — ABNORMAL LOW (ref 12.0–15.0)
Immature Granulocytes: 0 %
Lymphocytes Relative: 31 %
Lymphs Abs: 1.7 10*3/uL (ref 0.7–4.0)
MCH: 27.8 pg (ref 26.0–34.0)
MCHC: 32.1 g/dL (ref 30.0–36.0)
MCV: 86.8 fL (ref 80.0–100.0)
Monocytes Absolute: 0.4 10*3/uL (ref 0.1–1.0)
Monocytes Relative: 7 %
Neutro Abs: 3.2 10*3/uL (ref 1.7–7.7)
Neutrophils Relative %: 56 %
Platelets: 163 10*3/uL (ref 150–400)
RBC: 3.95 MIL/uL (ref 3.87–5.11)
RDW: 12.2 % (ref 11.5–15.5)
WBC: 5.6 10*3/uL (ref 4.0–10.5)
nRBC: 0 % (ref 0.0–0.2)

## 2018-04-10 LAB — POCT PREGNANCY, URINE: Preg Test, Ur: POSITIVE — AB

## 2018-04-10 LAB — LIPASE, BLOOD: Lipase: 25 U/L (ref 11–51)

## 2018-04-10 LAB — HCG, QUANTITATIVE, PREGNANCY: hCG, Beta Chain, Quant, S: 2642 m[IU]/mL — ABNORMAL HIGH (ref <5)

## 2018-04-10 MED ORDER — ONDANSETRON HCL 4 MG/2ML IJ SOLN
4.0000 mg | Freq: Once | INTRAMUSCULAR | Status: AC
  Administered 2018-04-10: 4 mg via INTRAVENOUS
  Filled 2018-04-10: qty 2

## 2018-04-10 MED ORDER — PROMETHAZINE HCL 25 MG PO TABS: 25.0000 mg | ORAL_TABLET | Freq: Four times a day (QID) | ORAL | 0 refills | Status: DC | PRN

## 2018-04-10 MED ORDER — SODIUM CHLORIDE 0.9 % IV BOLUS
1000.0000 mL | Freq: Once | INTRAVENOUS | Status: AC
  Administered 2018-04-10: 05:00:00 1000 mL via INTRAVENOUS
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
## 2018-04-10 NOTE — ED Triage Notes (Signed)
Patient ambulatory to triage with steady gait, without difficulty or distress noted; pt reports sent home from work for N/V and lower abd cramping and "my period hasn't come on"; st has had intermittent symptoms since 4/1

## 2018-04-10 NOTE — ED Notes (Signed)
Pt discharged home after verbalizing understanding of discharge instructions; nad noted. 

## 2018-04-10 NOTE — Discharge Instructions (Addendum)
1.  You may take Phenergan as needed for nausea/vomiting. 2.  Clear liquids x12 hours, then bland diet daily. 3.  Return to the ER for worsening symptoms, persistent vomiting, difficulty breathing or other concerns.

## 2018-04-15 ENCOUNTER — Ambulatory Visit (INDEPENDENT_AMBULATORY_CARE_PROVIDER_SITE_OTHER): Payer: Medicaid Other | Admitting: Advanced Practice Midwife

## 2018-04-15 ENCOUNTER — Encounter: Payer: Self-pay | Admitting: Advanced Practice Midwife

## 2018-04-15 ENCOUNTER — Other Ambulatory Visit: Payer: Self-pay

## 2018-04-15 ENCOUNTER — Other Ambulatory Visit (HOSPITAL_COMMUNITY)
Admission: RE | Admit: 2018-04-15 | Discharge: 2018-04-15 | Disposition: A | Discharge status: Home / Self Care | Payer: Medicaid Other | Source: Ambulatory Visit | Attending: Advanced Practice Midwife | Admitting: Advanced Practice Midwife

## 2018-04-15 VITALS — BP 98/60 | Wt 119.0 lb

## 2018-04-15 DIAGNOSIS — Z124 Encounter for screening for malignant neoplasm of cervix: Secondary | ICD-10-CM

## 2018-04-15 DIAGNOSIS — Z3481 Encounter for supervision of other normal pregnancy, first trimester: Secondary | ICD-10-CM

## 2018-04-15 DIAGNOSIS — Z3A01 Less than 8 weeks gestation of pregnancy: Secondary | ICD-10-CM | POA: Diagnosis not present

## 2018-04-15 DIAGNOSIS — Z3480 Encounter for supervision of other normal pregnancy, unspecified trimester: Secondary | ICD-10-CM | POA: Insufficient documentation

## 2018-04-15 DIAGNOSIS — Z113 Encounter for screening for infections with a predominantly sexual mode of transmission: Secondary | ICD-10-CM | POA: Diagnosis present

## 2018-04-15 DIAGNOSIS — F1290 Cannabis use, unspecified, uncomplicated: Secondary | ICD-10-CM

## 2018-04-15 DIAGNOSIS — Z1379 Encounter for other screening for genetic and chromosomal anomalies: Secondary | ICD-10-CM

## 2018-04-15 NOTE — Patient Instructions (Signed)
Exercise During Pregnancy For people of all ages, exercise is an important part of being healthy. Exercise improves heart and lung function and helps to maintain strength, flexibility, and a healthy body weight. Exercise also boosts energy levels and elevates mood. For most women, maintaining an exercise routine throughout pregnancy is recommended. It is only on rare occasions and with certain medical conditions or pregnancy complications that women may be asked to limit or avoid exercise during pregnancy. What are some other benefits to exercising during pregnancy? Along with maintaining strength and flexibility, exercising throughout pregnancy can help to:  Keep strength in muscles that are very important during labor and childbirth.  Decrease low back pain during pregnancy.  Decrease the risk of developing gestational diabetes mellitus (GDM).  Improve blood sugar (glucose) control for women who have GDM.  Decrease the risk of developing preeclampsia. This is a serious condition that causes high blood pressure along with other symptoms, such as swelling and headaches.  Decrease the risk of cesarean delivery.  Speed up the recovery after giving birth. How often should I exercise? Unless your health care provider gives you different instructions, you should try to exercise on most days or all days of the week. In general, try to exercise with moderate intensity for about 150 minutes per week. This can be spread out across several days, such as exercising for 30 minutes per day on 5 days of each week. You can tell that you are exercising at a moderate intensity if you have a higher heart rate and faster breathing, but you are still able to hold a conversation. What types of moderate-intensity exercise are recommended during pregnancy? There are many types of exercise that are safe for you to do during pregnancy. Unless your health care provider gives you different instructions, do a variety of  exercises that safely increase your heart and breathing (cardiopulmonary) rates and help you to build and maintain muscle strength (strength training). You should always be able to talk in full sentences while exercising during pregnancy. Some examples of exercising that is safe to do during pregnancy include:  Brisk walking or hiking.  Swimming.  Water aerobics.  Riding a stationary bike.  Strength training.  Modified yoga or Pilates. Tell your instructor that you are pregnant. Avoid overstretching and avoid lying on your back for long periods of time.  Running or jogging. Only choose this type of exercise if: ? You ran or jogged regularly before your pregnancy. ? You can run or jog and still talk in complete sentences. What types of exercise should I not do during pregnancy? Depending on your level of fitness and whether you exercised regularly before your pregnancy, you may be advised to limit vigorous-intensity exercise during your pregnancy. You can tell that you are exercising at a vigorous intensity if you are breathing much harder and faster and cannot hold a conversation while exercising. Some examples of exercising that you should avoid during pregnancy include:  Contact sports.  Activities that place you at risk for falling on or being hit in the belly, such as downhill skiing, water skiing, surfing, rock climbing, cycling, gymnastics, and horseback riding.  Scuba diving.  Sky diving.  Yoga or Pilates in a room that is heated to extreme temperatures ("hot yoga" or "hot Pilates").  Jogging or running, unless you ran or jogged regularly before your pregnancy. While jogging or running, you should always be able to talk in full sentences. Do not run or jog so vigorously that you   are unable to have a conversation.  If you are not used to exercising at elevation (more than 6,000 feet above sea level), do not do so during your pregnancy. When should I avoid exercising during  pregnancy? Certain medical conditions can make it unsafe to exercise during pregnancy, or they may increase your risk of miscarriage or early labor and birth. Some of these conditions include:  Some types of heart disease.  Some types of lung disease.  Placenta previa. This is when the placenta partially or completely covers the opening of the uterus (cervix).  Frequent bleeding from the vagina during your pregnancy.  Incompetent cervix. This is when your cervix does not remain as tightly closed during pregnancy as it should.  Premature labor.  Ruptured membranes. This is when the protective sac (amniotic sac) opens up and amniotic fluid leaks from your vagina.  Severely low blood count (anemia).  Preeclampsia or pregnancy-caused high blood pressure.  Carrying more than one baby (multiple gestation) and having an additional risk of early labor.  Poorly controlled diabetes.  Being severely underweight or severely overweight.  Intrauterine growth restriction. This is when your baby's growth and development during pregnancy are slower than expected.  Other medical conditions. Ask your health care provider if any apply to you. What else should I know about exercising during pregnancy? You should take these precautions while exercising during pregnancy:  Avoid overheating. ? Wear loose-fitting, breathable clothes. ? Do not exercise in very high temperatures.  Avoid dehydration. Drink enough water before, during, and after exercise to keep your urine clear or pale yellow.  Avoid overstretching. Because of hormone changes during pregnancy, it is easy to overstretch muscles, tendons, and ligaments during pregnancy.  Start slowly and ask your health care provider to recommend types of exercise that are safe for you, if exercising regularly is new for you. Pregnancy is not a time for exercising to lose weight. When should I seek medical care? You should stop exercising and call your  health care provider if you have any unusual symptoms, such as:  Mild uterine contractions or abdominal cramping.  Dizziness that does not improve with rest. When should I seek immediate medical care? You should stop exercising and call your local emergency services (911 in the [place] if you have any unusual symptoms, such as:  Sudden, severe pain in your low back or your belly.  Uterine contractions or abdominal cramping that do not improve with rest.  Chest pain.  Bleeding or fluid leaking from your vagina.  Shortness of breath. This information is not intended to replace advice given to you by your health care provider. Make sure you discuss any questions you have with your health care provider. Document Released: 12/18/2004 Document Revised: 05/18/2015 Document Reviewed: 02/25/2014 Elsevier Interactive Patient Education  2019 [employer] Eating Plan for Pregnant Women While you are pregnant, your body requires additional nutrition to help support your growing baby. You also have a higher need for some vitamins and minerals, such as folic acid, calcium, iron, and vitamin D. Eating a healthy, well-balanced diet is very important for your health and your baby's health. Your need for extra calories varies for the three 3-month segments of your pregnancy (trimesters). For most women, it is recommended to consume:  150 extra calories a day during the first trimester.  300 extra calories a day during the second trimester.  300 extra calories a day during the third trimester. What are tips for following this plan?   Do   not try to lose weight or go on a diet during pregnancy.  Limit your overall intake of foods that have "empty calories." These are foods that have little nutritional value, such as sweets, desserts, candies, and sugar-sweetened beverages.  Eat a variety of foods (especially fruits and vegetables) to get a full range of vitamins and minerals.  Take a prenatal vitamin  to help meet your additional vitamin and mineral needs during pregnancy, specifically for folic acid, iron, calcium, and vitamin D.  Remember to stay active. Ask your health care provider what types of exercise and activities are safe for you.  Practice good food safety and cleanliness. Wash your hands before you eat and after you prepare raw meat. Wash all fruits and vegetables well before peeling or eating. Taking these actions can help to prevent food-borne illnesses that can be very dangerous to your baby, such as listeriosis. Ask your health care provider for more information about listeriosis. What does 150 extra calories look like? Healthy options that provide 150 extra calories each day could be any of the following:  6-8 oz (170-230 g) of plain low-fat yogurt with  cup of berries.  1 apple with 2 teaspoons (11 g) of peanut butter.  Cut-up vegetables with  cup (60 g) of hummus.  8 oz (230 mL) or 1 cup of low-fat chocolate milk.  1 stick of string cheese with 1 medium orange.  1 peanut butter and jelly sandwich that is made with one slice of whole-wheat bread and 1 tsp (5 g) of peanut butter. For 300 extra calories, you could eat two of those healthy options each day. What is a healthy amount of weight to gain? The right amount of weight gain for you is based on your BMI before you became pregnant. If your BMI:  Was less than 18 (underweight), you should gain 28-40 lb (13-18 kg).  Was 18-24.9 (normal), you should gain 25-35 lb (11-16 kg).  Was 25-29.9 (overweight), you should gain 15-25 lb (7-11 kg).  Was 30 or greater (obese), you should gain 11-20 lb (5-9 kg). What if I am having twins or multiples? Generally, if you are carrying twins or multiples:  You may need to eat 300-600 extra calories a day.  The recommended range for total weight gain is 25-54 lb (11-25 kg), depending on your BMI before pregnancy.  Talk with your health care provider to find out about  nutritional needs, weight gain, and exercise that is right for you. What foods can I eat?  Grains All grains. Choose whole grains, such as whole-wheat bread, oatmeal, or brown rice. Vegetables All vegetables. Eat a variety of colors and types of vegetables. Remember to wash your vegetables well before peeling or eating. Fruits All fruits. Eat a variety of colors and types of fruit. Remember to wash your fruits well before peeling or eating. Meats and other protein foods Lean meats, including chicken, turkey, fish, and lean cuts of beef, veal, or pork. If you eat fish or seafood, choose options that are higher in omega-3 fatty acids and lower in mercury, such as salmon, herring, mussels, trout, sardines, pollock, shrimp, crab, and lobster. Tofu. Tempeh. Beans. Eggs. Peanut butter and other nut butters. Make sure that all meats, poultry, and eggs are cooked to food-safe temperatures or "well-done." Two or more servings of fish are recommended each week in order to get the most benefits from omega-3 fatty acids that are found in seafood. Choose fish that are lower in mercury. You can   find more information online:  www.fda.gov Dairy Pasteurized milk and milk alternatives (such as almond milk). Pasteurized yogurt and pasteurized cheese. Cottage cheese. Sour cream. Beverages Water. Juices that contain 100% fruit juice or vegetable juice. Caffeine-free teas and decaffeinated coffee. Drinks that contain caffeine are okay to drink, but it is better to avoid caffeine. Keep your total caffeine intake to less than 200 mg each day (which is 12 oz or 355 mL of coffee, tea, or soda) or the limit as told by your health care provider. Fats and oils Fats and oils are okay to include in moderation. Sweets and desserts Sweets and desserts are okay to include in moderation. Seasoning and other foods All pasteurized condiments. The items listed above may not be a complete list of recommended foods and beverages.  Contact your dietitian for more options. What foods are not recommended? Vegetables Raw (unpasteurized) vegetable juices. Fruits Unpasteurized fruit juices. Meats and other protein foods Lunch meats, bologna, hot dogs, or other deli meats. (If you must eat those meats, reheat them until they are steaming hot.) Refrigerated pat, meat spreads from a meat counter, smoked seafood that is found in the refrigerated section of a store. Raw or undercooked meats, poultry, and eggs. Raw fish, such as sushi or sashimi. Fish that have high mercury content, such as tilefish, shark, swordfish, and king mackerel. To learn more about mercury in fish, talk with your health care provider or look for online resources, such as:  www.fda.gov Dairy Raw (unpasteurized) milk and any foods that have raw milk in them. Soft cheeses, such as feta, queso blanco, queso fresco, Brie, Camembert cheeses, blue-veined cheeses, and Panela cheese (unless it is made with pasteurized milk, which must be stated on the label). Beverages Alcohol. Sugar-sweetened beverages, such as sodas, teas, or energy drinks. Seasoning and other foods Homemade fermented foods and drinks, such as pickles, sauerkraut, or kombucha drinks. (Store-bought pasteurized versions of these are okay.) Salads that are made in a store or deli, such as ham salad, chicken salad, egg salad, tuna salad, and seafood salad. The items listed above may not be a complete list of foods and beverages to avoid. Contact your dietitian for more information. Where to find more information To calculate the number of calories you need based on your height, weight, and activity level, you can use an online calculator such as:  www.choosemyplate.gov/MyPlatePlan To calculate how much weight you should gain during pregnancy, you can use an online pregnancy weight gain calculator such as:  www.choosemyplate.gov/pregnancy-weight-gain-calculator Summary  While you are pregnant,  your body requires additional nutrition to help support your growing baby.  Eat a variety of foods, especially fruits and vegetables to get a full range of vitamins and minerals.  Practice good food safety and cleanliness. Wash your hands before you eat and after you prepare raw meat. Wash all fruits and vegetables well before peeling or eating. Taking these actions can help to prevent food-borne illnesses, such as listeriosis, that can be very dangerous to your baby.  Do not eat raw meat or fish. Do not eat fish that have high mercury content, such as tilefish, shark, swordfish, and king mackerel. Do not eat unpasteurized (raw) dairy.  Take a prenatal vitamin to help meet your additional vitamin and mineral needs during pregnancy, specifically for folic acid, iron, calcium, and vitamin D. This information is not intended to replace advice given to you by your health care provider. Make sure you discuss any questions you have with your health care   provider. Document Released: 10/02/2013 Document Revised: 09/14/2016 Document Reviewed: 09/14/2016 Elsevier Interactive Patient Education  2019 [employer] Prenatal Care Prenatal care is health care during pregnancy. It helps you and your unborn baby (fetus) stay as healthy as possible. Prenatal care may be provided by a midwife, a family practice health care provider, or a childbirth and pregnancy specialist (obstetrician). How does this affect me? During pregnancy, you will be closely monitored for any new conditions that might develop. To lower your risk of pregnancy complications, you and your health care provider will talk about any underlying conditions you have. How does this affect my baby? Early and consistent prenatal care increases the chance that your baby will be healthy during pregnancy. Prenatal care lowers the risk that your baby will be:  Born early (prematurely).  Smaller than expected at birth (small for gestational age). What  can I expect at the first prenatal care visit? Your first prenatal care visit will likely be the longest. You should schedule your first prenatal care visit as soon as you know that you are pregnant. Your first visit is a good time to talk about any questions or concerns you have about pregnancy. At your visit, you and your health care provider will talk about:  Your medical history, including: ? Any past pregnancies. ? Your family's medical history. ? The baby's father's medical history. ? Any long-term (chronic) health conditions you have and how you manage them. ? Any surgeries or procedures you have had. ? Any current over-the-counter or prescription medicines, herbs, or supplements you are taking.  Other factors that could pose a risk to your baby, including:  Your home setting and your stress levels, including: ? Exposure to abuse or violence. ? Household financial strain. ? Mental health conditions you have.  Your daily health habits, including diet and exercise. Your health care provider will also:  Measure your weight, height, and blood pressure.  Do a physical exam, including a pelvic and breast exam.  Perform blood tests and urine tests to check for: ? Urinary tract infection. ? Sexually transmitted infections (STIs). ? Low iron levels in your blood (anemia). ? Blood type and certain proteins on red blood cells (Rh antibodies). ? Infections and immunity to viruses, such as hepatitis B and rubella. ? HIV (human immunodeficiency virus).  Do an ultrasound to confirm your baby's growth and development and to help predict your estimated due date (EDD). This ultrasound is done with a probe that is inserted into the vagina (transvaginal ultrasound).  Discuss your options for genetic screening.  Give you information about how to keep yourself and your baby healthy, including: ? Nutrition and taking vitamins. ? Physical activity. ? How to manage pregnancy symptoms such as  nausea and vomiting (morning sickness). ? Infections and substances that may be harmful to your baby and how to avoid them. ? Food safety. ? Dental care. ? Working. ? Travel. ? Warning signs to watch for and when to call your health care provider. How often will I have prenatal care visits? After your first prenatal care visit, you will have regular visits throughout your pregnancy. The visit schedule is often as follows:  Up to week 28 of pregnancy: once every 4 weeks.  28-36 weeks: once every 2 weeks.  After 36 weeks: every week until delivery. Some women may have visits more or less often depending on any underlying health conditions and the health of the baby. Keep all follow-up and prenatal care visits as told by   your health care provider. This is important. What happens during routine prenatal care visits? Your health care provider will:  Measure your weight and blood pressure.  Check for fetal heart sounds.  Measure the height of your uterus in your abdomen (fundal height). This may be measured starting around week 20 of pregnancy.  Check the position of your baby inside your uterus.  Ask questions about your diet, sleeping patterns, and whether you can feel the baby move.  Review warning signs to watch for and signs of labor.  Ask about any pregnancy symptoms you are having and how you are dealing with them. Symptoms may include: ? Headaches. ? Nausea and vomiting. ? Vaginal discharge. ? Swelling. ? Fatigue. ? Constipation. ? Any discomfort, including back or pelvic pain. Make a list of questions to ask your health care provider at your routine visits. What tests might I have during prenatal care visits? You may have blood, urine, and imaging tests throughout your pregnancy, such as:  Urine tests to check for glucose, protein, or signs of infection.  Glucose tests to check for a form of diabetes that can develop during pregnancy (gestational diabetes mellitus).  This is usually done around week 24 of pregnancy.  An ultrasound to check your baby's growth and development and to check for birth defects. This is usually done around week 20 of pregnancy.  A test to check for group B strep (GBS) infection. This is usually done around week 36 of pregnancy.  Genetic testing. This may include blood or imaging tests, such as an ultrasound. Some genetic tests are done during the first trimester and some are done during the second trimester. What else can I expect during prenatal care visits? Your health care provider may recommend getting certain vaccines during pregnancy. These may include:  A yearly flu shot (annual influenza vaccine). This is especially important if you will be pregnant during flu season.  Tdap (tetanus, diphtheria, pertussis) vaccine. Getting this vaccine during pregnancy can protect your baby from whooping cough (pertussis) after birth. This vaccine may be recommended between weeks 27 and 36 of pregnancy. Later in your pregnancy, your health care provider may give you information about:  Childbirth and breastfeeding classes.  Choosing a health care provider for your baby.  Umbilical cord banking.  Breastfeeding.  Birth control after your baby is born.  The hospital labor and delivery unit and how to tour it.  Registering at the hospital before you go into labor. Where to find more information  Office on Women's Health: womenshealth.gov  American Pregnancy Association: americanpregnancy.org  March of Dimes: marchofdimes.org Summary  Prenatal care helps you and your baby stay as healthy as possible during pregnancy.  Your first prenatal care visit will most likely be the longest.  You will have visits and tests throughout your pregnancy to monitor your health and your baby's health.  Bring a list of questions to your visits to ask your health care provider.  Make sure to keep all follow-up and prenatal care visits with  your health care provider. This information is not intended to replace advice given to you by your health care provider. Make sure you discuss any questions you have with your health care provider. Document Released: 12/21/2002 Document Revised: 12/17/2016 Document Reviewed: 12/17/2016 Elsevier Interactive Patient Education  2019 [employer]  

## 2018-04-15 NOTE — Progress Notes (Signed)
NOB N&V/ER follow up

## 2018-04-16 ENCOUNTER — Other Ambulatory Visit: Payer: Self-pay | Admitting: Advanced Practice Midwife

## 2018-04-16 DIAGNOSIS — A5901 Trichomonal vulvovaginitis: Secondary | ICD-10-CM

## 2018-04-16 DIAGNOSIS — Z3480 Encounter for supervision of other normal pregnancy, unspecified trimester: Principal | ICD-10-CM

## 2018-04-16 DIAGNOSIS — O0990 Supervision of high risk pregnancy, unspecified, unspecified trimester: Secondary | ICD-10-CM | POA: Insufficient documentation

## 2018-04-16 LAB — RPR+RH+ABO+RUB AB+AB SCR+CB...
Antibody Screen: NEGATIVE
HIV Screen 4th Generation wRfx: NONREACTIVE
Hematocrit: 35.7 % (ref 34.0–46.6)
Hemoglobin: 11.7 g/dL (ref 11.1–15.9)
Hepatitis B Surface Ag: NEGATIVE
MCH: 28.4 pg (ref 26.6–33.0)
MCHC: 32.8 g/dL (ref 31.5–35.7)
MCV: 87 fL (ref 79–97)
Platelets: 162 10*3/uL (ref 150–450)
RBC: 4.12 x10E6/uL (ref 3.77–5.28)
RDW: 11.7 % (ref 11.7–15.4)
RPR Ser Ql: NONREACTIVE
Rh Factor: POSITIVE
Rubella Antibodies, IGG: 2.42 index (ref >0.99)
Varicella zoster IgG: 668 index (ref >165)
WBC: 5.9 10*3/uL (ref 3.4–10.8)

## 2018-04-16 LAB — CYTOLOGY - PAP
Chlamydia: NEGATIVE
Diagnosis: NEGATIVE
Neisseria Gonorrhea: NEGATIVE
Trichomonas: POSITIVE — AB

## 2018-04-16 LAB — HEMOGLOBINOPATHY EVALUATION
HGB C: 0 %
HGB S: 0 %
HGB VARIANT: 0 %
Hemoglobin A2 Quantitation: 2.5 % (ref 1.8–3.2)
Hemoglobin F Quantitation: 0 % (ref 0.0–2.0)
Hgb A: 97.5 % (ref 96.4–98.8)

## 2018-04-16 MED ORDER — METRONIDAZOLE 500 MG PO TABS: 500.0000 mg | ORAL_TABLET | Freq: Two times a day (BID) | ORAL | 0 refills | Status: AC
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
## 2018-04-16 NOTE — Progress Notes (Signed)
Rx metronidazole sent to patient pharmacy for trichomonas infection.

## 2018-04-17 LAB — URINE CULTURE

## 2018-04-18 LAB — URINE DRUG PANEL 7
Amphetamines, Urine: NEGATIVE ng/mL
Barbiturate Quant, Ur: NEGATIVE ng/mL
Benzodiazepine Quant, Ur: NEGATIVE ng/mL
Cannabinoid Quant, Ur: POSITIVE — AB
Cocaine (Metab.): NEGATIVE ng/mL
Opiate Quant, Ur: NEGATIVE ng/mL
PCP Quant, Ur: NEGATIVE ng/mL

## 2018-04-26 ENCOUNTER — Other Ambulatory Visit: Payer: Self-pay

## 2018-04-26 ENCOUNTER — Emergency Department
Admission: EM | Admit: 2018-04-26 | Discharge: 2018-04-26 | Disposition: A | Discharge status: Home / Self Care | Payer: Medicaid Other | Attending: Emergency Medicine | Admitting: Emergency Medicine

## 2018-04-26 DIAGNOSIS — O210 Mild hyperemesis gravidarum: Secondary | ICD-10-CM

## 2018-04-26 DIAGNOSIS — J45909 Unspecified asthma, uncomplicated: Secondary | ICD-10-CM | POA: Diagnosis not present

## 2018-04-26 DIAGNOSIS — Z3A00 Weeks of gestation of pregnancy not specified: Secondary | ICD-10-CM | POA: Insufficient documentation

## 2018-04-26 DIAGNOSIS — O219 Vomiting of pregnancy, unspecified: Secondary | ICD-10-CM | POA: Insufficient documentation

## 2018-04-26 DIAGNOSIS — F17210 Nicotine dependence, cigarettes, uncomplicated: Secondary | ICD-10-CM | POA: Insufficient documentation

## 2018-04-26 DIAGNOSIS — Z79899 Other long term (current) drug therapy: Secondary | ICD-10-CM | POA: Diagnosis not present

## 2018-04-26 LAB — BASIC METABOLIC PANEL
Anion gap: 8 (ref 5–15)
BUN: 18 mg/dL (ref 6–20)
CO2: 21 mmol/L — ABNORMAL LOW (ref 22–32)
Calcium: 8.5 mg/dL — ABNORMAL LOW (ref 8.9–10.3)
Chloride: 107 mmol/L (ref 98–111)
Creatinine, Ser: 0.48 mg/dL (ref 0.44–1.00)
GFR calc Af Amer: >60 mL/min (ref >60)
GFR calc non Af Amer: >60 mL/min (ref >60)
Glucose, Bld: 87 mg/dL (ref 70–99)
Potassium: 3.8 mmol/L (ref 3.5–5.1)
Sodium: 136 mmol/L (ref 135–145)

## 2018-04-26 LAB — CBC
HCT: 34.5 % — ABNORMAL LOW (ref 36.0–46.0)
Hemoglobin: 11.1 g/dL — ABNORMAL LOW (ref 12.0–15.0)
MCH: 28 pg (ref 26.0–34.0)
MCHC: 32.2 g/dL (ref 30.0–36.0)
MCV: 86.9 fL (ref 80.0–100.0)
Platelets: 146 10*3/uL — ABNORMAL LOW (ref 150–400)
RBC: 3.97 MIL/uL (ref 3.87–5.11)
RDW: 12.2 % (ref 11.5–15.5)
WBC: 5.5 10*3/uL (ref 4.0–10.5)
nRBC: 0 % (ref 0.0–0.2)

## 2018-04-26 LAB — URINALYSIS, COMPLETE (UACMP) WITH MICROSCOPIC
Bacteria, UA: NONE SEEN
Bilirubin Urine: NEGATIVE
Glucose, UA: NEGATIVE mg/dL
Hgb urine dipstick: NEGATIVE
Ketones, ur: NEGATIVE mg/dL
Leukocytes,Ua: NEGATIVE
Nitrite: NEGATIVE
Protein, ur: NEGATIVE mg/dL
Specific Gravity, Urine: 1.017 (ref 1.005–1.030)
pH: 7 (ref 5.0–8.0)

## 2018-04-26 MED ORDER — ONDANSETRON HCL 4 MG/2ML IJ SOLN
4.0000 mg | Freq: Once | INTRAMUSCULAR | Status: AC
  Administered 2018-04-26: 4 mg via INTRAVENOUS
  Filled 2018-04-26: qty 2

## 2018-04-26 MED ORDER — MAGNESIUM SULFATE IN D5W 1-5 GM/100ML-% IV SOLN
1.0000 g | Freq: Once | INTRAVENOUS | Status: AC
  Administered 2018-04-26: 12:00:00 1 g via INTRAVENOUS
  Filled 2018-04-26: qty 100

## 2018-04-26 MED ORDER — DOXYLAMINE-PYRIDOXINE 10-10 MG PO TBEC: 2.0000 | DELAYED_RELEASE_TABLET | Freq: Every day | ORAL | 0 refills | Status: DC

## 2018-04-26 MED ORDER — SODIUM CHLORIDE 0.9 % IV BOLUS
1000.0000 mL | Freq: Once | INTRAVENOUS | Status: AC
  Administered 2018-04-26: 1000 mL via INTRAVENOUS

## 2018-04-26 NOTE — ED Notes (Signed)
ED Provider at bedside. 

## 2018-04-26 NOTE — ED Notes (Signed)
Pt reports emesis x3 today. Reports unable to keep any PO yesterday. Reports symptoms unrelieved with PO home meds.

## 2018-04-26 NOTE — ED Triage Notes (Signed)
A&O, ambulatory. No distress noted.  States "I can't hold nothing down." when asked when this started she states "it happened over time" but states it began again yesterday. 3rd pregnancy per pt. About 6-[redacted] weeks pregnant.
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
## 2018-04-26 NOTE — ED Notes (Signed)
Pt states she currently does not feel nauseous at this time. Pt states she is ready to attempt PO challenge. Pt given ginger ale, and apple sauce at this time

## 2018-04-30 ENCOUNTER — Ambulatory Visit (INDEPENDENT_AMBULATORY_CARE_PROVIDER_SITE_OTHER): Payer: Medicaid Other

## 2018-04-30 ENCOUNTER — Other Ambulatory Visit: Payer: Self-pay

## 2018-04-30 ENCOUNTER — Ambulatory Visit (INDEPENDENT_AMBULATORY_CARE_PROVIDER_SITE_OTHER): Payer: Medicaid Other | Admitting: Certified Nurse Midwife

## 2018-04-30 ENCOUNTER — Encounter: Payer: Self-pay | Admitting: Certified Nurse Midwife

## 2018-04-30 VITALS — BP 90/50 | Wt 122.0 lb

## 2018-04-30 DIAGNOSIS — Z3A01 Less than 8 weeks gestation of pregnancy: Secondary | ICD-10-CM

## 2018-04-30 DIAGNOSIS — Z3480 Encounter for supervision of other normal pregnancy, unspecified trimester: Secondary | ICD-10-CM

## 2018-04-30 DIAGNOSIS — A599 Trichomoniasis, unspecified: Secondary | ICD-10-CM

## 2018-04-30 DIAGNOSIS — O3481 Maternal care for other abnormalities of pelvic organs, first trimester: Secondary | ICD-10-CM

## 2018-04-30 DIAGNOSIS — O34219 Maternal care for unspecified type scar from previous cesarean delivery: Secondary | ICD-10-CM

## 2018-04-30 DIAGNOSIS — N8311 Corpus luteum cyst of right ovary: Secondary | ICD-10-CM | POA: Diagnosis not present

## 2018-04-30 DIAGNOSIS — O9989 Other specified diseases and conditions complicating pregnancy, childbirth and the puerperium: Secondary | ICD-10-CM

## 2018-04-30 MED ORDER — METRONIDAZOLE 500 MG PO TABS: ORAL_TABLET | ORAL | 0 refills | Status: DC

## 2018-04-30 NOTE — Progress Notes (Signed)
ROB and Dating u/s- no concerns

## 2018-04-30 NOTE — Progress Notes (Signed)
ROB and dating ultrasound at 7wk3d: Ultrasound today: SIUP at 7wk4d with FCA 167. Will keep current EDC by sure LMP Reviewed NOB labs: A POS, other labs normal or negative except for + Trichimoniasis. RX for FLagyl for both patient and partner. Talked with both regarding taking with food, and not having intercourse for 5-6 days after treatment. Partner and patient both have no allergies. Explained that drinking alcohol within several days of taking medication may result in antabuse reaction.  Seen in ER fr nausea and vomiting on 4/25, but has not picked up RX for Diclegis yet. Discussed how to take medication. Weight is up 3#. ROB in 3-4 weeks. Will get MaterniT 21 at that time

## 2018-05-05 ENCOUNTER — Telehealth: Payer: Self-pay

## 2018-05-05 MED ORDER — ONDANSETRON 8 MG PO TBDP: 8.0000 mg | ORAL_TABLET | Freq: Three times a day (TID) | ORAL | 1 refills | Status: DC | PRN

## 2018-05-05 NOTE — Telephone Encounter (Signed)
Having continued problems with nausea and vomiting with pregnancy. Diclegis not helping-taking 4 pills/day. Zofran ODT 4 mgm helping a little. Phenergan-she vomits back up. Can't work for going back and forth to bathroom-works at a [employer]. Manager wants her to take off until nausea and vomiting under control. Needs a letter so they will hold her job for her. Letter written. To pick up in AM. Advised to make follow up appointment in 1 week. Farrel Conners, CNM

## 2018-05-05 NOTE — Telephone Encounter (Signed)
Pt states she would like for Colleen to call her regarding the medication that was sent to pharmacy and also she has questions regarding her last appointment. Diclegis isn't working. CB# [phone]

## 2018-05-13 ENCOUNTER — Other Ambulatory Visit: Payer: Self-pay

## 2018-05-13 ENCOUNTER — Ambulatory Visit (INDEPENDENT_AMBULATORY_CARE_PROVIDER_SITE_OTHER): Payer: Medicaid Other | Admitting: Certified Nurse Midwife

## 2018-05-13 VITALS — BP 102/60 | Wt 119.0 lb

## 2018-05-13 DIAGNOSIS — Z3A09 9 weeks gestation of pregnancy: Secondary | ICD-10-CM

## 2018-05-13 DIAGNOSIS — Z3480 Encounter for supervision of other normal pregnancy, unspecified trimester: Secondary | ICD-10-CM

## 2018-05-13 DIAGNOSIS — O219 Vomiting of pregnancy, unspecified: Secondary | ICD-10-CM

## 2018-05-13 MED ORDER — FAMOTIDINE 20 MG PO TABS: 20.0000 mg | ORAL_TABLET | Freq: Two times a day (BID) | ORAL | 3 refills | Status: DC

## 2018-05-13 NOTE — Progress Notes (Signed)
Medication follow up- pt feels the same Vaginal discharge, no itchiness or itchiness, odor at times

## 2018-05-13 NOTE — Progress Notes (Signed)
ROB at [facility]: Having difficulty with nausea and vomiting. Was placed on maternity leave until improved. Taking Zofran BID, vomits in Am  Pasta seems to stay down best. Experimenting with different foods and drinks to see what stays down.  Wt back down to starting weight. Advised to start back on Diclegis at hs, can continue Zofran as needed. Pepcid prescribed for heartburn RTO as scheduled in 2 weeks for follow up DT check and MAterniT 21 or sooner prn Was not able to keep down Flagyl for Trichimonas. Will need to be prescribed again when nausea and vomiting improved.  Farrel Conners, CNM

## 2018-05-21 ENCOUNTER — Encounter: Payer: Self-pay | Admitting: Obstetrics and Gynecology

## 2018-05-21 ENCOUNTER — Ambulatory Visit (INDEPENDENT_AMBULATORY_CARE_PROVIDER_SITE_OTHER): Payer: Medicaid Other | Admitting: Obstetrics and Gynecology

## 2018-05-21 ENCOUNTER — Other Ambulatory Visit: Payer: Self-pay

## 2018-05-21 VITALS — BP 110/64 | Wt 124.0 lb

## 2018-05-21 DIAGNOSIS — A599 Trichomoniasis, unspecified: Secondary | ICD-10-CM

## 2018-05-21 DIAGNOSIS — O34219 Maternal care for unspecified type scar from previous cesarean delivery: Secondary | ICD-10-CM

## 2018-05-21 DIAGNOSIS — Z1379 Encounter for other screening for genetic and chromosomal anomalies: Secondary | ICD-10-CM

## 2018-05-21 DIAGNOSIS — O0990 Supervision of high risk pregnancy, unspecified, unspecified trimester: Secondary | ICD-10-CM

## 2018-05-21 DIAGNOSIS — Z3A10 10 weeks gestation of pregnancy: Secondary | ICD-10-CM

## 2018-05-21 NOTE — Progress Notes (Signed)
Routine Prenatal Care Visit  Subjective  Carolyn Dawson is a 28 y.o. G3P2002 at [redacted]w[redacted]d being seen today for ongoing prenatal care.  She is currently monitored for the following issues for this high-risk pregnancy and has Supervision of high risk pregnancy, antepartum; Trichomoniasis; and Previous cesarean section complicating pregnancy on their problem list.  ----------------------------------------------------------------------------------- Patient reports continued nausea, but it is improving.    . Vag. Bleeding: None.  Movement: Absent. Denies leaking of fluid.  ----------------------------------------------------------------------------------- The following portions of the patient's history were reviewed and updated as appropriate: allergies, current medications, past family history, past medical history, past social history, past surgical history and problem list. Problem list updated.   Objective  Blood pressure 110/64, weight 124 lb (56.2 kg), last menstrual period 03/09/2018. Pregravid weight 119 lb (54 kg) Total Weight Gain 5 lb (2.268 kg) Urinalysis: Urine Protein    Urine Glucose    Fetal Status: Fetal Heart Rate (bpm): 160   Movement: Absent     General:  Alert, oriented and cooperative. Patient is in no acute distress.  Skin: Skin is warm and dry. No rash noted.   Cardiovascular: Normal heart rate noted  Respiratory: Normal respiratory effort, no problems with respiration noted  Abdomen: Soft, gravid, appropriate for gestational age.       Pelvic:  Cervical exam deferred        Extremities: Normal range of motion.     Mental Status: Normal mood and affect. Normal behavior. Normal judgment and thought content.   Assessment   28 y.o. [id number] at [redacted]w[redacted]d by  12/14/2018, by Last Menstrual Period presenting for routine prenatal visit  Plan   Pregnancy#3 Problems (from 03/09/18 to present)    Problem Noted Resolved   Trichomoniasis 04/30/2018 by Farrel Conners, CNM No   Overview Signed 05/21/2018  9:46 AM by Conard Novak, MD    - dx 4/29, flagyl not tolerated - re-prescribe 2nd trimester [ ]       Previous cesarean section complicating pregnancy 04/30/2018 by Farrel Conners, CNM No   Overview Signed 04/30/2018  5:41 PM by Farrel Conners, CNM    Arrest of dilation 2015      Supervision of high risk pregnancy, antepartum 04/16/2018 by Tresea Mall, CNM No   Overview Addendum 04/30/2018  5:41 PM by Farrel Conners, CNM    Clinic [facility] Prenatal Labs  Dating LMP=7wk [place] Blood type: A/Positive/-- (04/14 0940)   Genetic Screen 1 Screen:    AFP:     Quad:     NIPS: Antibody:Negative (04/14 0940)  Anatomic [place]  Rubella: 2.42 (04/14 0940) Varicella:Immune  GTT Early:               Third trimester:  RPR: Non Reactive (04/14 0940)   Rhogam  HBsAg: Negative (04/14 0940)   TDaP vaccine                       Flu Shot: HIV: Non Reactive (04/14 0940)   Baby Food   Breast                             GBS:   Contraception  Pap:  CBB     CS/VBAC SVD 2012, C/S 2015   Support Person Christopher              Preterm labor symptoms and general obstetric precautions including but not limited to vaginal bleeding, contractions, leaking of fluid and  fetal movement were reviewed in detail with the patient. Please refer to After Visit Summary for other counseling recommendations.   - NIPT today  Return in about 4 weeks (around 06/18/2018) for Routine Prenatal Appointment/telephone.  Thomasene MohairStephen Ekaterina Denise, MD, Merlinda FrederickFACOG [facility], [facility] 05/21/2018 10:09 AM

## 2018-05-26 LAB — MATERNIT 21 PLUS CORE, BLOOD
Fetal Fraction: 9
Result (T21): NEGATIVE
Trisomy 13 (Patau syndrome): NEGATIVE
Trisomy 18 (Edwards syndrome): NEGATIVE
Trisomy 21 (Down syndrome): NEGATIVE

## 2018-05-27 ENCOUNTER — Telehealth: Payer: Self-pay

## 2018-05-27 NOTE — Telephone Encounter (Signed)
Pt aware of results and gender.

## 2018-05-27 NOTE — Progress Notes (Signed)
Would you mind letting her know her MaterniT21 results were normal and the gender of the baby, if she wants to know it? Thanks!

## 2018-05-27 NOTE — Telephone Encounter (Signed)
-----   Message from Conard Novak, MD sent at 05/27/2018  9:42 AM EDT ----- Would you mind letting her know her MaterniT21 results were normal and the gender of the baby, if she wants to know it? Thanks!

## 2018-05-29 ENCOUNTER — Telehealth: Payer: Self-pay

## 2018-05-30 LAB — HM HIV SCREENING LAB: HM HIV Screening: NEGATIVE

## 2018-06-13 ENCOUNTER — Encounter: Payer: Self-pay | Admitting: Emergency Medicine

## 2018-06-13 ENCOUNTER — Other Ambulatory Visit: Payer: Self-pay

## 2018-06-13 ENCOUNTER — Emergency Department
Admission: EM | Admit: 2018-06-13 | Discharge: 2018-06-13 | Disposition: A | Discharge status: Home / Self Care | Payer: Medicaid Other | Attending: Emergency Medicine | Admitting: Emergency Medicine

## 2018-06-13 ENCOUNTER — Emergency Department: Payer: Medicaid Other

## 2018-06-13 DIAGNOSIS — O210 Mild hyperemesis gravidarum: Secondary | ICD-10-CM | POA: Insufficient documentation

## 2018-06-13 DIAGNOSIS — F17210 Nicotine dependence, cigarettes, uncomplicated: Secondary | ICD-10-CM | POA: Insufficient documentation

## 2018-06-13 DIAGNOSIS — J45909 Unspecified asthma, uncomplicated: Secondary | ICD-10-CM | POA: Diagnosis not present

## 2018-06-13 DIAGNOSIS — Z3A14 14 weeks gestation of pregnancy: Secondary | ICD-10-CM | POA: Diagnosis not present

## 2018-06-13 LAB — CBC WITH DIFFERENTIAL/PLATELET
Abs Immature Granulocytes: 0.02 10*3/uL (ref 0.00–0.07)
Basophils Absolute: 0 10*3/uL (ref 0.0–0.1)
Basophils Relative: 1 %
Eosinophils Absolute: 0.1 10*3/uL (ref 0.0–0.5)
Eosinophils Relative: 2 %
HCT: 35 % — ABNORMAL LOW (ref 36.0–46.0)
Hemoglobin: 11.2 g/dL — ABNORMAL LOW (ref 12.0–15.0)
Immature Granulocytes: 0 %
Lymphocytes Relative: 20 %
Lymphs Abs: 1.3 10*3/uL (ref 0.7–4.0)
MCH: 28.1 pg (ref 26.0–34.0)
MCHC: 32 g/dL (ref 30.0–36.0)
MCV: 87.7 fL (ref 80.0–100.0)
Monocytes Absolute: 0.3 10*3/uL (ref 0.1–1.0)
Monocytes Relative: 5 %
Neutro Abs: 4.8 10*3/uL (ref 1.7–7.7)
Neutrophils Relative %: 72 %
Platelets: 134 10*3/uL — ABNORMAL LOW (ref 150–400)
RBC: 3.99 MIL/uL (ref 3.87–5.11)
RDW: 12.5 % (ref 11.5–15.5)
WBC: 6.6 10*3/uL (ref 4.0–10.5)
nRBC: 0 % (ref 0.0–0.2)

## 2018-06-13 LAB — COMPREHENSIVE METABOLIC PANEL
ALT: 10 U/L (ref 0–44)
AST: 19 U/L (ref 15–41)
Albumin: 3.6 g/dL (ref 3.5–5.0)
Alkaline Phosphatase: 22 U/L — ABNORMAL LOW (ref 38–126)
Anion gap: 8 (ref 5–15)
BUN: 9 mg/dL (ref 6–20)
CO2: 22 mmol/L (ref 22–32)
Calcium: 8.6 mg/dL — ABNORMAL LOW (ref 8.9–10.3)
Chloride: 105 mmol/L (ref 98–111)
Creatinine, Ser: 0.38 mg/dL — ABNORMAL LOW (ref 0.44–1.00)
GFR calc Af Amer: >60 mL/min (ref >60)
GFR calc non Af Amer: >60 mL/min (ref >60)
Glucose, Bld: 88 mg/dL (ref 70–99)
Potassium: 3.7 mmol/L (ref 3.5–5.1)
Sodium: 135 mmol/L (ref 135–145)
Total Bilirubin: 0.4 mg/dL (ref 0.3–1.2)
Total Protein: 6.6 g/dL (ref 6.5–8.1)

## 2018-06-13 LAB — URINALYSIS, COMPLETE (UACMP) WITH MICROSCOPIC
Bilirubin Urine: NEGATIVE
Glucose, UA: NEGATIVE mg/dL
Hgb urine dipstick: NEGATIVE
Ketones, ur: NEGATIVE mg/dL
Leukocytes,Ua: NEGATIVE
Nitrite: NEGATIVE
Protein, ur: NEGATIVE mg/dL
Specific Gravity, Urine: 1.015 (ref 1.005–1.030)
WBC, UA: NONE SEEN WBC/hpf (ref 0–5)
pH: 7 (ref 5.0–8.0)

## 2018-06-13 LAB — LIPASE, BLOOD: Lipase: 27 U/L (ref 11–51)

## 2018-06-13 LAB — HCG, QUANTITATIVE, PREGNANCY: hCG, Beta Chain, Quant, S: 65681 m[IU]/mL — ABNORMAL HIGH (ref <5)

## 2018-06-13 MED ORDER — METOCLOPRAMIDE HCL 10 MG PO TABS: 10.0000 mg | ORAL_TABLET | Freq: Three times a day (TID) | ORAL | 1 refills | Status: DC | PRN

## 2018-06-13 MED ORDER — ONDANSETRON 4 MG PO TBDP: 4.0000 mg | ORAL_TABLET | Freq: Three times a day (TID) | ORAL | 1 refills | Status: DC | PRN

## 2018-06-13 MED ORDER — ONDANSETRON HCL 4 MG/2ML IJ SOLN
4.0000 mg | Freq: Once | INTRAMUSCULAR | Status: AC
  Administered 2018-06-13: 4 mg via INTRAVENOUS
  Filled 2018-06-13: qty 2

## 2018-06-13 MED ORDER — SODIUM CHLORIDE 0.9 % IV SOLN
Freq: Once | INTRAVENOUS | Status: AC
  Administered 2018-06-13: 10:00:00 via INTRAVENOUS

## 2018-06-13 NOTE — ED Triage Notes (Signed)
Patient presents to the ED with nausea and pregnancy.  Patient states she is [redacted] weeks pregnant.  Patient also reports occasional headaches.  Patient states nausea and vomiting began yesterday.  Patient states, "I really haven't been able to keep anything down."
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
## 2018-06-18 ENCOUNTER — Encounter: Payer: Self-pay | Admitting: Advanced Practice Midwife

## 2018-06-18 ENCOUNTER — Ambulatory Visit (INDEPENDENT_AMBULATORY_CARE_PROVIDER_SITE_OTHER): Payer: Medicaid Other | Admitting: Advanced Practice Midwife

## 2018-06-18 ENCOUNTER — Other Ambulatory Visit: Payer: Self-pay

## 2018-06-18 DIAGNOSIS — O34219 Maternal care for unspecified type scar from previous cesarean delivery: Secondary | ICD-10-CM

## 2018-06-18 DIAGNOSIS — Z3A14 14 weeks gestation of pregnancy: Secondary | ICD-10-CM | POA: Diagnosis not present

## 2018-06-18 DIAGNOSIS — O0992 Supervision of high risk pregnancy, unspecified, second trimester: Secondary | ICD-10-CM

## 2018-06-18 DIAGNOSIS — O0990 Supervision of high risk pregnancy, unspecified, unspecified trimester: Secondary | ICD-10-CM

## 2018-06-18 NOTE — Progress Notes (Signed)
Routine Prenatal Care Visit- Virtual Visit  Subjective   Virtual Visit via Telephone Note  I connected with the patient on 06/18/18 at  8:10 AM EDT by telephone and verified that I am speaking with the correct person using two identifiers.   I discussed the limitations, risks, security and privacy concerns of performing an evaluation and management service by telephone and the availability of in person appointments. I also discussed with the patient that there may be a patient responsible charge related to this service. The patient expressed understanding and agreed to proceed.  The patient was at home I spoke with the patient from my  Office phone The names of people involved in this encounter were: the patient Carolyn Dawson and myself Carolyn Dawson, CNM   Carolyn Dawson is a 28 y.o. [phone] at 5666w3d being seen today for ongoing prenatal care.  She is currently monitored for the following issues for this high-risk pregnancy and has Supervision of high risk pregnancy, antepartum; Trichomoniasis; and Previous cesarean section complicating pregnancy on their problem list.  ----------------------------------------------------------------------------------- Patient reports nausea some mornings. None of the medications were helping so she is not taking anything for the nausea currently. She expresses an interest in having her boyfriend catch the baby.    .  .   . Denies leaking of fluid.  ----------------------------------------------------------------------------------- The following portions of the patient's history were reviewed and updated as appropriate: allergies, current medications, past family history, past medical history, past social history, past surgical history and problem list. Problem list updated.   Objective  Last menstrual period 03/09/2018. Pregravid weight 119 lb (54 kg) Total Weight Gain 5 lb (2.268 kg) Urinalysis:      Fetal Status:           Physical Exam could not be  performed. Because of the COVID-19 outbreak this visit was performed over the phone and not in person.   Assessment   28 y.o. [id number] at 6666w3d by  12/14/2018, by Last Menstrual Period presenting for routine prenatal visit  Plan   Pregnancy#3 Problems (from 03/09/18 to present)    Problem Noted Resolved   Trichomoniasis 04/30/2018 by Carolyn Dawson, CNM No   Overview Signed 05/21/2018  9:46 AM by Carolyn NovakJackson, Stephen D, MD    - dx 4/29, flagyl not tolerated - re-prescribe 2nd trimester [ ]       Previous cesarean section complicating pregnancy 04/30/2018 by Carolyn Dawson, CNM No   Overview Signed 04/30/2018  5:41 PM by Carolyn Dawson, CNM    Arrest of dilation 2015      Supervision of high risk pregnancy, antepartum 04/16/2018 by Carolyn Dawson, CNM No   Overview Addendum 04/30/2018  5:41 PM by Carolyn Dawson, CNM    [facility]inic [facility] Prenatal Labs  Dating LMP=7wk US Blood type: A/Positive/-- (04/14 0940)   Genetic Screen 1 Screen:    AFP:     Quad:     NIPS: Antibody:Negative (04/14 0940)  Anatomic US  Rubella: 2.42 (04/14 0940) Varicella:Immune  GTT Early:               Third trimester:  RPR: Non Reactive (04/14 0940)   Rhogam  HBsAg: Negative (04/14 0940)   TDaP vaccine                       Flu Shot: HIV: Non Reactive (04/14 0940)   Baby Food   Breast  GBS:   Contraception  Pap:  CBB     CS/VBAC SVD 2012, C/S 2015   Support Person Carolyn Dawson              Gestational age appropriate obstetric precautions including but not limited to vaginal bleeding, contractions, leaking of fluid and fetal movement were reviewed in detail with the patient.     Follow Up Instructions: Continue comfort measures for nausea of pregnancy Stay well hydrated   I discussed the assessment and treatment plan with the patient. The patient was provided an opportunity to ask questions and all were answered. The patient agreed with the plan and demonstrated an  understanding of the instructions.   The patient was advised to call back or seek an in-person evaluation if the symptoms worsen or if the condition fails to improve as anticipated.  I provided 10 minutes of non-face-to-face time during this encounter.  Return in about 4 weeks (around 07/16/2018) for anatomy scan and rob.  Carolyn Dawson, CNM [facility] 06/18/18, 8:30 AM

## 2018-06-18 NOTE — Progress Notes (Signed)
ROB- no concerns 

## 2018-07-01 ENCOUNTER — Other Ambulatory Visit: Payer: Self-pay

## 2018-07-01 ENCOUNTER — Encounter: Payer: Self-pay | Admitting: Licensed Clinical Social Worker

## 2018-07-01 ENCOUNTER — Ambulatory Visit: Payer: Self-pay | Admitting: Licensed Clinical Social Worker

## 2018-07-01 DIAGNOSIS — F411 Generalized anxiety disorder: Secondary | ICD-10-CM
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
## 2018-07-16 ENCOUNTER — Ambulatory Visit (INDEPENDENT_AMBULATORY_CARE_PROVIDER_SITE_OTHER): Payer: Medicaid Other | Admitting: Advanced Practice Midwife

## 2018-07-16 ENCOUNTER — Encounter: Payer: Medicaid Other | Admitting: Obstetrics and Gynecology

## 2018-07-16 ENCOUNTER — Encounter: Payer: Self-pay | Admitting: Advanced Practice Midwife

## 2018-07-16 ENCOUNTER — Ambulatory Visit (INDEPENDENT_AMBULATORY_CARE_PROVIDER_SITE_OTHER): Payer: Medicaid Other

## 2018-07-16 ENCOUNTER — Other Ambulatory Visit: Payer: Self-pay

## 2018-07-16 DIAGNOSIS — Z3A18 18 weeks gestation of pregnancy: Secondary | ICD-10-CM

## 2018-07-16 DIAGNOSIS — O34219 Maternal care for unspecified type scar from previous cesarean delivery: Secondary | ICD-10-CM | POA: Diagnosis not present

## 2018-07-16 DIAGNOSIS — Z363 Encounter for antenatal screening for malformations: Secondary | ICD-10-CM

## 2018-07-16 DIAGNOSIS — O0990 Supervision of high risk pregnancy, unspecified, unspecified trimester: Secondary | ICD-10-CM

## 2018-07-16 NOTE — Progress Notes (Signed)
Anatomy scan today. No vb. No lof.  ?

## 2018-07-16 NOTE — Progress Notes (Signed)
Routine Prenatal Care Visit- Virtual Visit  Subjective   Virtual Visit via Telephone Note  I connected with [name] on 07/16/18 at  3:30 PM EDT by telephone and verified that I am speaking with the correct person using two identifiers.   I discussed the limitations, risks, security and privacy concerns of performing an evaluation and management service by telephone and the availability of in person appointments. I also discussed with the patient that there may be a patient responsible charge related to this service. The patient expressed understanding and agreed to proceed.  The patient was in the office earlier in the afternoon for her anatomy scan and was scheduled to be seen by Dr Gilman Schmidt who had to leave for emergency C/Section. The patient preferred to be called rather than wait to be seen in the office.  The patient was at home I spoke with the patient from my  Office phone The names of people involved in this encounter were: the patient Carolyn Dawson and myself Rod Can, CNM   Carolyn Dawson is a 28 y.o. [phone] at [redacted]w[redacted]d being seen today for ongoing prenatal care.  She is currently monitored for the following issues for this high-risk pregnancy and has Supervision of high risk pregnancy, antepartum; Trichomoniasis; and Previous cesarean section complicating pregnancy on their problem list.  ----------------------------------------------------------------------------------- Patient reports no complaints.    . Vag. Bleeding: None.  Movement: Present. Denies leaking of fluid.  ----------------------------------------------------------------------------------- The following portions of the patient's history were reviewed and updated as appropriate: allergies, current medications, past family history, past medical history, past social history, past surgical history and problem list. Problem list updated.   Objective  Last menstrual period 03/09/2018. Pregravid weight 119 lb (54 kg)  Total Weight Gain 5 lb (2.268 kg) Urinalysis:      Fetal Status: Fetal Heart Rate (bpm): 158   Movement: Present     Anatomy scan is complete, normal, female, anterior placenta  Physical Exam could not be performed. Because of the COVID-19 outbreak this visit was performed over the phone and not in person.   Assessment   28 y.o. [id number] at [redacted]w[redacted]d by  12/14/2018, by Last Menstrual Period presenting for routine prenatal visit  Plan   Pregnancy#3 Problems (from 03/09/18 to present)    Problem Noted Resolved   Trichomoniasis 04/30/2018 by Dalia Heading, CNM No   Overview Signed 05/21/2018  9:46 AM by Will Bonnet, MD    - dx 4/29, flagyl not tolerated - re-prescribe 2nd trimester [ ]       Previous cesarean section complicating pregnancy 5/39/7673 by Dalia Heading, CNM No   Overview Signed 04/30/2018  5:41 PM by Dalia Heading, CNM    Arrest of dilation 2015      Supervision of high risk pregnancy, antepartum 04/16/2018 by Rod Can, CNM No   Overview Addendum 04/30/2018  5:41 PM by Dalia Heading, [place] Prenatal Labs  Dating LMP=7wk [place] Blood type: A/Positive/-- (04/14 0940)   Genetic Screen 1 Screen:    AFP:     Quad:     NIPS: Antibody:Negative (04/14 0940)  Anatomic [place]  Rubella: 2.42 (04/14 0940) Varicella:Immune  GTT Early:               Third trimester:  RPR: Non Reactive (04/14 0940)   Rhogam  HBsAg: Negative (04/14 0940)   TDaP vaccine  Flu Shot: HIV: Non Reactive (04/14 0940)   Baby Food   Breast                             GBS:   Contraception  Pap:  CBB     CS/VBAC SVD 2012, C/S 2015   Support Person Cristal DeerChristopher              Gestational age appropriate obstetric precautions including but not limited to vaginal bleeding, contractions, leaking of fluid and fetal movement were reviewed in detail with the patient.     Follow Up Instructions:    I discussed the assessment and treatment plan with the  patient. The patient was provided an opportunity to ask questions and all were answered. The patient agreed with the plan and demonstrated an understanding of the instructions.   The patient was advised to call back or seek an in-person evaluation if the symptoms worsen or if the condition fails to improve as anticipated.  I provided 10 minutes of non-face-to-face time during this encounter.  Return in about 4 weeks (around 08/13/2018) for rob.   [name], CNM [facility] 07/16/2018 3:38 PM

## 2018-07-17 ENCOUNTER — Ambulatory Visit: Payer: Medicaid Other | Admitting: Licensed Clinical Social Worker

## 2018-07-28 ENCOUNTER — Encounter: Payer: Self-pay | Admitting: Family Medicine

## 2018-08-13 ENCOUNTER — Encounter: Payer: Self-pay | Admitting: Maternal Newborn

## 2018-08-13 ENCOUNTER — Other Ambulatory Visit: Payer: Self-pay

## 2018-08-13 ENCOUNTER — Ambulatory Visit (INDEPENDENT_AMBULATORY_CARE_PROVIDER_SITE_OTHER): Payer: Medicaid Other | Admitting: Maternal Newborn

## 2018-08-13 VITALS — BP 100/60 | Wt 139.0 lb

## 2018-08-13 DIAGNOSIS — O0992 Supervision of high risk pregnancy, unspecified, second trimester: Secondary | ICD-10-CM

## 2018-08-13 DIAGNOSIS — O0990 Supervision of high risk pregnancy, unspecified, unspecified trimester: Secondary | ICD-10-CM

## 2018-08-13 DIAGNOSIS — A599 Trichomoniasis, unspecified: Secondary | ICD-10-CM

## 2018-08-13 DIAGNOSIS — O34219 Maternal care for unspecified type scar from previous cesarean delivery: Secondary | ICD-10-CM

## 2018-08-13 MED ORDER — METRONIDAZOLE 500 MG PO TABS: 2000.0000 mg | ORAL_TABLET | Freq: Once | ORAL | 0 refills | Status: AC

## 2018-08-13 NOTE — Progress Notes (Signed)
Routine Prenatal Care Visit  Subjective  Carolyn Dawson is a 28 y.o. G3P2002 at 7735w3d being seen today for ongoing prenatal care.  She is currently monitored for the following issues for this high-risk pregnancy and has Supervision of high risk pregnancy, antepartum; Trichomoniasis; and Previous cesarean section complicating pregnancy on their problem list.  ----------------------------------------------------------------------------------- Patient reports nausea has improved, still has emesis once every week or so. Occasional spotting. Some dizziness when standing for long periods, improves with sitting down/resting. Vag. Bleeding: Scant.  Movement: Present. No leaking of fluid.  ----------------------------------------------------------------------------------- The following portions of the patient's history were reviewed and updated as appropriate: allergies, current medications, past family history, past medical history, past social history, past surgical history and problem list. Problem list updated.   Objective  Blood pressure 100/60, weight 139 lb (63 kg), last menstrual period 03/09/2018. Pregravid weight 119 lb (54 kg) Total Weight Gain 20 lb (9.072 kg) Urinalysis: No sample today  Fetal Status: Fetal Heart Rate (bpm): 144 Fundal Height: 23 cm Movement: Present     General:  Alert, oriented and cooperative. Patient is in no acute distress.  Skin: Skin is warm and dry. No rash noted.   Cardiovascular: Normal heart rate noted  Respiratory: Normal respiratory effort, no problems with respiration noted  Abdomen: Soft, gravid, appropriate for gestational age. Pain/Pressure: Present     Pelvic:  Cervical exam deferred        Extremities: Normal range of motion.  Edema: None  Mental Status: Normal mood and affect. Normal behavior. Normal judgment and thought content.     Assessment   28 y.o. [id number] at 7535w3d, EDD 12/14/2018 by Last Menstrual Period presenting for a routine  prenatal visit.  Plan   Pregnancy#3 Problems (from 03/09/18 to present)    Problem Noted Resolved   Trichomoniasis 04/30/2018 by Farrel ConnersGutierrez, Colleen, CNM No   Overview Signed 05/21/2018  9:46 AM by Conard NovakJackson, Stephen D, MD    - dx 4/29, flagyl not tolerated - re-prescribe 2nd trimester [ ]       Previous cesarean section complicating pregnancy 04/30/2018 by Farrel ConnersGutierrez, Colleen, CNM No   Overview Signed 04/30/2018  5:41 PM by Farrel ConnersGutierrez, Colleen, CNM    Arrest of dilation 2015      Supervision of high risk pregnancy, antepartum 04/16/2018 by Tresea MallGledhill, Jane, CNM No   Overview Addendum 08/13/2018  8:58 AM by Oswaldo ConroySchmid, Saprina Chuong Y, CNM    Clinic [facility] Prenatal Labs  Dating LMP=7wk US Blood type: A/Positive/-- (04/14 0940)   Genetic Screen NIPS: Negative XX Antibody:Negative (04/14 0940)  Anatomic US Complete 07/16/2018 Rubella: 2.42 (04/14 0940) Varicella: Immune  GTT Early:               Third trimester:  RPR: Non Reactive (04/14 0940)   Rhogam N/A HBsAg: Negative (04/14 0940)   TDaP vaccine                       Flu Shot: HIV: Non Reactive (04/14 0940)   Baby Food   Breast                             GBS:   Contraception  Pap: 04/15/2018 NILM  CBB     CS/VBAC SVD 2012, C/S 2015   Support Person Cristal DeerChristopher           Discussed treating again with [place] for trichomoniasis as this may be the cause of occasional  spotting. She agreed and Rx was sent to pharmacy.  Unable to tolerate prenatal vitamins, recommended chewable Flintstones or gummies with iron.  Please refer to After Visit Summary for other counseling recommendations.   Return in about 4 weeks (around 09/10/2018) for ROB with GTT/labs.  Avel Sensor, CNM 08/13/2018  9:12 AM

## 2018-08-13 NOTE — Patient Instructions (Signed)

## 2018-09-10 ENCOUNTER — Ambulatory Visit (INDEPENDENT_AMBULATORY_CARE_PROVIDER_SITE_OTHER): Payer: Medicaid Other | Admitting: Certified Nurse Midwife

## 2018-09-10 ENCOUNTER — Encounter: Payer: Medicaid Other | Admitting: Obstetrics and Gynecology

## 2018-09-10 ENCOUNTER — Other Ambulatory Visit: Payer: Medicaid Other

## 2018-09-10 ENCOUNTER — Other Ambulatory Visit: Payer: Self-pay

## 2018-09-10 VITALS — BP 110/60 | Wt 145.0 lb

## 2018-09-10 DIAGNOSIS — O26842 Uterine size-date discrepancy, second trimester: Secondary | ICD-10-CM

## 2018-09-10 DIAGNOSIS — O34219 Maternal care for unspecified type scar from previous cesarean delivery: Secondary | ICD-10-CM

## 2018-09-10 DIAGNOSIS — O0992 Supervision of high risk pregnancy, unspecified, second trimester: Secondary | ICD-10-CM

## 2018-09-10 DIAGNOSIS — O0990 Supervision of high risk pregnancy, unspecified, unspecified trimester: Secondary | ICD-10-CM

## 2018-09-10 DIAGNOSIS — F1290 Cannabis use, unspecified, uncomplicated: Secondary | ICD-10-CM

## 2018-09-10 DIAGNOSIS — Z3A26 26 weeks gestation of pregnancy: Secondary | ICD-10-CM

## 2018-09-10 LAB — POCT URINALYSIS DIPSTICK OB: Glucose, UA: NEGATIVE

## 2018-09-10 NOTE — Progress Notes (Signed)
C/o can't get comfortable to sleep - lying on side makes her back hurt and she has pressure but she doesn't have to pee.rj

## 2018-09-10 NOTE — Progress Notes (Signed)
ROB at 26wk3d: Hx of prior CS for FTP. Remembers getting to 6 or 8cm prior to her cervix swelling up. Baby was almost a pound bigger than her first baby which she delivered vaginally. This baby has a different father. She has gained 26# with the pregnancy. Was thinking of having a water labor/birth this pregnancy but was advised that because of Covid we were not offering water labor/birth and she would not be a candidate due to her prior Cesarean section. We discussed the risk of uterine rupture with TOLAC of <1% if the labor is spontaneous and a higher risk with IOL. Given [facility] consent for TOLAC to take home and review with her significant other. Can bring consent back to next ROB visit in 2 weeks FH 29 cm at 26 weeks. Will get growth scan in 2 weeks  Br and bottle UDS today Dalia Heading, CNM

## 2018-09-11 LAB — 28 WEEK RH+PANEL
Basophils Absolute: 0 10*3/uL (ref 0.0–0.2)
Basos: 0 %
EOS (ABSOLUTE): 0.1 10*3/uL (ref 0.0–0.4)
Eos: 1 %
Gestational Diabetes Screen: 94 mg/dL (ref 65–139)
HIV Screen 4th Generation wRfx: NONREACTIVE
Hematocrit: 34.7 % (ref 34.0–46.6)
Hemoglobin: 11.3 g/dL (ref 11.1–15.9)
Immature Grans (Abs): 0 10*3/uL (ref 0.0–0.1)
Immature Granulocytes: 0 %
Lymphocytes Absolute: 1.1 10*3/uL (ref 0.7–3.1)
Lymphs: 14 %
MCH: 28.4 pg (ref 26.6–33.0)
MCHC: 32.6 g/dL (ref 31.5–35.7)
MCV: 87 fL (ref 79–97)
Monocytes Absolute: 0.4 10*3/uL (ref 0.1–0.9)
Monocytes: 5 %
Neutrophils Absolute: 6.4 10*3/uL (ref 1.4–7.0)
Neutrophils: 80 %
Platelets: 131 10*3/uL — ABNORMAL LOW (ref 150–450)
RBC: 3.98 x10E6/uL (ref 3.77–5.28)
RDW: 11.9 % (ref 11.7–15.4)
RPR Ser Ql: NONREACTIVE
WBC: 8.1 10*3/uL (ref 3.4–10.8)

## 2018-09-15 LAB — URINE DRUG PANEL 7
Amphetamines, Urine: NEGATIVE ng/mL
Barbiturate Quant, Ur: NEGATIVE ng/mL
Benzodiazepine Quant, Ur: NEGATIVE ng/mL
Cannabinoid Quant, Ur: POSITIVE — AB
Cocaine (Metab.): NEGATIVE ng/mL
Opiate Quant, Ur: NEGATIVE ng/mL
PCP Quant, Ur: NEGATIVE ng/mL

## 2018-09-17 ENCOUNTER — Other Ambulatory Visit: Payer: Self-pay

## 2018-09-17 ENCOUNTER — Emergency Department
Admission: EM | Admit: 2018-09-17 | Discharge: 2018-09-17 | Disposition: A | Discharge status: Home / Self Care | Payer: Medicaid Other | Attending: Emergency Medicine | Admitting: Emergency Medicine

## 2018-09-17 DIAGNOSIS — Z3A00 Weeks of gestation of pregnancy not specified: Secondary | ICD-10-CM | POA: Insufficient documentation

## 2018-09-17 DIAGNOSIS — J45909 Unspecified asthma, uncomplicated: Secondary | ICD-10-CM | POA: Insufficient documentation

## 2018-09-17 DIAGNOSIS — K922 Gastrointestinal hemorrhage, unspecified: Secondary | ICD-10-CM | POA: Diagnosis present

## 2018-09-17 DIAGNOSIS — Z87891 Personal history of nicotine dependence: Secondary | ICD-10-CM | POA: Diagnosis not present

## 2018-09-17 DIAGNOSIS — O210 Mild hyperemesis gravidarum: Secondary | ICD-10-CM | POA: Diagnosis not present

## 2018-09-17 LAB — COMPREHENSIVE METABOLIC PANEL
ALT: 11 U/L (ref 0–44)
AST: 23 U/L (ref 15–41)
Albumin: 3.2 g/dL — ABNORMAL LOW (ref 3.5–5.0)
Alkaline Phosphatase: 33 U/L — ABNORMAL LOW (ref 38–126)
Anion gap: 7 (ref 5–15)
BUN: 8 mg/dL (ref 6–20)
CO2: 23 mmol/L (ref 22–32)
Calcium: 8.6 mg/dL — ABNORMAL LOW (ref 8.9–10.3)
Chloride: 106 mmol/L (ref 98–111)
Creatinine, Ser: 0.5 mg/dL (ref 0.44–1.00)
GFR calc Af Amer: >60 mL/min (ref >60)
GFR calc non Af Amer: >60 mL/min (ref >60)
Glucose, Bld: 94 mg/dL (ref 70–99)
Potassium: 3.6 mmol/L (ref 3.5–5.1)
Sodium: 136 mmol/L (ref 135–145)
Total Bilirubin: 0.4 mg/dL (ref 0.3–1.2)
Total Protein: 6.6 g/dL (ref 6.5–8.1)

## 2018-09-17 LAB — CBC
HCT: 35.2 % — ABNORMAL LOW (ref 36.0–46.0)
Hemoglobin: 11.2 g/dL — ABNORMAL LOW (ref 12.0–15.0)
MCH: 28 pg (ref 26.0–34.0)
MCHC: 31.8 g/dL (ref 30.0–36.0)
MCV: 88 fL (ref 80.0–100.0)
Platelets: 133 10*3/uL — ABNORMAL LOW (ref 150–400)
RBC: 4 MIL/uL (ref 3.87–5.11)
RDW: 12.5 % (ref 11.5–15.5)
WBC: 8.9 10*3/uL (ref 4.0–10.5)
nRBC: 0 % (ref 0.0–0.2)

## 2018-09-17 LAB — TYPE AND SCREEN
ABO/RH(D): A POS
Antibody Screen: NEGATIVE

## 2018-09-17 MED ORDER — DEXTROSE IN LACTATED RINGERS 5 % IV SOLN
1000.0000 mL | Freq: Once | INTRAVENOUS | Status: AC
  Administered 2018-09-17: 1000 mL via INTRAVENOUS

## 2018-09-17 MED ORDER — PROMETHAZINE HCL 25 MG PO TABS: 25.0000 mg | ORAL_TABLET | ORAL | 1 refills | Status: DC | PRN

## 2018-09-17 MED ORDER — FAMOTIDINE IN NACL 20-0.9 MG/50ML-% IV SOLN
20.0000 mg | Freq: Once | INTRAVENOUS | Status: AC
  Administered 2018-09-17: 20 mg via INTRAVENOUS
  Filled 2018-09-17: qty 50

## 2018-09-17 MED ORDER — PROMETHAZINE HCL 25 MG/ML IJ SOLN
25.0000 mg | Freq: Once | INTRAMUSCULAR | Status: AC
  Administered 2018-09-17: 25 mg via INTRAVENOUS
  Filled 2018-09-17: qty 1

## 2018-09-17 MED ORDER — ONDANSETRON HCL 4 MG/2ML IJ SOLN
4.0000 mg | Freq: Once | INTRAMUSCULAR | Status: AC
  Administered 2018-09-17: 4 mg via INTRAVENOUS
  Filled 2018-09-17: qty 2

## 2018-09-17 NOTE — ED Notes (Signed)
Pt reports she started vomiting at 4am and noticed blood in emesis - she reports that her throat and chest "started burning" - pt reports that she has been unable to hold anything down - vomited x3 in 24 hours - pt pregnant [redacted] weeks

## 2018-09-17 NOTE — ED Triage Notes (Signed)
Pt states she is [redacted] weeks pregnant and has had issues with N/V through out the pregnancy states this morning around 4am she had N/V with red blood in emesis and 2-3 more times with coffee ground like emesis since and "burning pain in my throat"..denies any abd pain or other sx.
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
## 2018-09-25 ENCOUNTER — Ambulatory Visit (INDEPENDENT_AMBULATORY_CARE_PROVIDER_SITE_OTHER): Payer: Medicaid Other | Admitting: Maternal Newborn

## 2018-09-25 ENCOUNTER — Encounter: Payer: Self-pay | Admitting: Maternal Newborn

## 2018-09-25 ENCOUNTER — Ambulatory Visit (INDEPENDENT_AMBULATORY_CARE_PROVIDER_SITE_OTHER): Payer: Medicaid Other

## 2018-09-25 ENCOUNTER — Other Ambulatory Visit: Payer: Self-pay

## 2018-09-25 VITALS — BP 110/80 | Wt 150.0 lb

## 2018-09-25 DIAGNOSIS — O34219 Maternal care for unspecified type scar from previous cesarean delivery: Secondary | ICD-10-CM

## 2018-09-25 DIAGNOSIS — O26842 Uterine size-date discrepancy, second trimester: Secondary | ICD-10-CM

## 2018-09-25 DIAGNOSIS — Z362 Encounter for other antenatal screening follow-up: Secondary | ICD-10-CM | POA: Diagnosis not present

## 2018-09-25 DIAGNOSIS — O0990 Supervision of high risk pregnancy, unspecified, unspecified trimester: Secondary | ICD-10-CM

## 2018-09-25 DIAGNOSIS — Z3A28 28 weeks gestation of pregnancy: Secondary | ICD-10-CM

## 2018-09-25 NOTE — Progress Notes (Signed)
ROB/Growth scan- no concerns/declines flu shot

## 2018-09-25 NOTE — Progress Notes (Signed)
Routine Prenatal Care Visit  Subjective  Carolyn Dawson is a 28 y.o. G3P2002 at [redacted]w[redacted]d being seen today for ongoing prenatal care.  She is currently monitored for the following issues for this high-risk pregnancy and has Supervision of high risk pregnancy, antepartum; Trichomoniasis; and Previous cesarean section complicating pregnancy on their problem list.  ----------------------------------------------------------------------------------- Patient was seen in the ED last week for nausea and vomiting with bloody emesis. This has not happened any more since then and she is feeling better. Contractions: Not present. Vag. Bleeding: None.  Movement: Present. No leaking of fluid.  ----------------------------------------------------------------------------------- The following portions of the patient's history were reviewed and updated as appropriate: allergies, current medications, past family history, past medical history, past social history, past surgical history and problem list. Problem list updated.   Objective  Blood pressure 110/80, weight 150 lb (68 kg), last menstrual period 03/09/2018. Pregravid weight 119 lb (54 kg) Total Weight Gain 31 lb (14.1 kg)  Fetal Status: Fetal Heart Rate (bpm): 153 ([place])   Movement: Present     General:  Alert, oriented and cooperative. Patient is in no acute distress.  Skin: Skin is warm and dry. No rash noted.   Cardiovascular: Normal heart rate noted  Respiratory: Normal respiratory effort, no problems with respiration noted  Abdomen: Soft, gravid, appropriate for gestational age. Pain/Pressure: Present     Pelvic:  Cervical exam deferred        Extremities: Normal range of motion.  Edema: None  Mental Status: Normal mood and affect. Normal behavior. Normal judgment and thought content.     Assessment   28 y.o. [id number] at [redacted]w[redacted]d EDD 12/14/2018, by Last Menstrual Period presenting for a routine prenatal visit.  Plan   Pregnancy#3 Problems (from  03/09/18 to present)    Problem Noted Resolved   Trichomoniasis 04/30/2018 by Farrel Conners, CNM No   Overview Signed 05/21/2018  9:46 AM by Conard Novak, MD    - dx 4/29, flagyl not tolerated - re-prescribe 2nd trimester [ ]       Previous cesarean section complicating pregnancy 04/30/2018 by 05/02/2018, CNM No   Overview Signed 04/30/2018  5:41 PM by 05/02/2018, CNM    Arrest of dilation 2015      Supervision of high risk pregnancy, antepartum 04/16/2018 by 04/18/2018, CNM No   Overview Addendum 09/20/2018  7:37 PM by 09/22/2018, CNM    Clinic [facility] Prenatal Labs  Dating LMP=7wk Farrel Conners Blood type: A/Positive/-- (04/14 0940)   Genetic Screen NIPS: Negative XX Antibody:Negative (04/14 0940)  Anatomic 05-23-1991 Complete 07/16/2018 Rubella: 2.42 (04/14 0940) Varicella: Immune  GTT Early:               Third trimester:  RPR: Non Reactive (04/14 0940)   Rhogam N/A HBsAg: Negative (04/14 0940)   TDaP vaccine                       Flu Shot: HIV: Non Reactive (04/14 0940)   Baby Food   Breast  / BT                           GBS:   Contraception  Pap: 04/15/2018 NILM  CBB     CS/VBAC SVD 2012, C/S 2015   Support Person 2016           Growth scan today shows fetal growth at 56.9%, EFW 2 lb, 14 oz, cephalic presentation,  FHR 153 bpm, normal AFI at 21.1 cm. Results reviewed with patient.  Please refer to After Visit Summary for other counseling recommendations.   Return in about 2 weeks (around 10/09/2018) for ROB.  Avel Sensor, CNM 09/25/2018  3:50 PM

## 2018-10-09 ENCOUNTER — Ambulatory Visit (INDEPENDENT_AMBULATORY_CARE_PROVIDER_SITE_OTHER): Payer: Medicaid Other | Admitting: Advanced Practice Midwife

## 2018-10-09 ENCOUNTER — Other Ambulatory Visit: Payer: Self-pay

## 2018-10-09 ENCOUNTER — Encounter: Payer: Self-pay | Admitting: Advanced Practice Midwife

## 2018-10-09 VITALS — BP 122/74 | Wt 154.0 lb

## 2018-10-09 DIAGNOSIS — O34219 Maternal care for unspecified type scar from previous cesarean delivery: Secondary | ICD-10-CM

## 2018-10-09 DIAGNOSIS — Z3A30 30 weeks gestation of pregnancy: Secondary | ICD-10-CM

## 2018-10-09 NOTE — Progress Notes (Signed)
TDAP at nv. No vb. No lof.

## 2018-10-09 NOTE — Patient Instructions (Signed)
Third Trimester of Pregnancy The third trimester is from week 28 through week 40 (months 7 through 9). The third trimester is a time when the unborn baby (fetus) is growing rapidly. At the end of the ninth month, the fetus is about 20 inches in length and weighs 6-10 pounds. Body changes during your third trimester Your body will continue to go through many changes during pregnancy. The changes vary from woman to woman. During the third trimester:  Your weight will continue to increase. You can expect to gain 25-35 pounds (11-16 kg) by the end of the pregnancy.  You may begin to get stretch marks on your hips, abdomen, and breasts.  You may urinate more often because the fetus is moving lower into your pelvis and pressing on your bladder.  You may develop or continue to have heartburn. This is caused by increased hormones that slow down muscles in the digestive tract.  You may develop or continue to have constipation because increased hormones slow digestion and cause the muscles that push waste through your intestines to relax.  You may develop hemorrhoids. These are swollen veins (varicose veins) in the rectum that can itch or be painful.  You may develop swollen, bulging veins (varicose veins) in your legs.  You may have increased body aches in the pelvis, back, or thighs. This is due to weight gain and increased hormones that are relaxing your joints.  You may have changes in your hair. These can include thickening of your hair, rapid growth, and changes in texture. Some women also have hair loss during or after pregnancy, or hair that feels dry or thin. Your hair will most likely return to normal after your baby is born.  Your breasts will continue to grow and they will continue to become tender. A yellow fluid (colostrum) may leak from your breasts. This is the first milk you are producing for your baby.  Your belly button may stick out.  You may notice more swelling in your hands,  face, or ankles.  You may have increased tingling or numbness in your hands, arms, and legs. The skin on your belly may also feel numb.  You may feel short of breath because of your expanding uterus.  You may have more problems sleeping. This can be caused by the size of your belly, increased need to urinate, and an increase in your body's metabolism.  You may notice the fetus "dropping," or moving lower in your abdomen (lightening).  You may have increased vaginal discharge.  You may notice your joints feel loose and you may have pain around your pelvic bone. What to expect at prenatal visits You will have prenatal exams every 2 weeks until week 36. Then you will have weekly prenatal exams. During a routine prenatal visit:  You will be weighed to make sure you and the baby are growing normally.  Your blood pressure will be taken.  Your abdomen will be measured to track your baby's growth.  The fetal heartbeat will be listened to.  Any test results from the previous visit will be discussed.  You may have a cervical check near your due date to see if your cervix has softened or thinned (effaced).  You will be tested for Group B streptococcus. This happens between 35 and 37 weeks. Your health care provider may ask you:  What your birth plan is.  How you are feeling.  If you are feeling the baby move.  If you have had any abnormal   symptoms, such as leaking fluid, bleeding, severe headaches, or abdominal cramping.  If you are using any tobacco products, including cigarettes, chewing tobacco, and electronic cigarettes.  If you have any questions. Other tests or screenings that may be performed during your third trimester include:  Blood tests that check for low iron levels (anemia).  Fetal testing to check the health, activity level, and growth of the fetus. Testing is done if you have certain medical conditions or if there are problems during the pregnancy.  Nonstress test  (NST). This test checks the health of your baby to make sure there are no signs of problems, such as the baby not getting enough oxygen. During this test, a belt is placed around your belly. The baby is made to move, and its heart rate is monitored during movement. What is false labor? False labor is a condition in which you feel small, irregular tightenings of the muscles in the womb (contractions) that usually go away with rest, changing position, or drinking water. These are called Braxton Hicks contractions. Contractions may last for hours, days, or even weeks before true labor sets in. If contractions come at regular intervals, become more frequent, increase in intensity, or become painful, you should see your health care provider. What are the signs of labor?  Abdominal cramps.  Regular contractions that start at 10 minutes apart and become stronger and more frequent with time.  Contractions that start on the top of the uterus and spread down to the lower abdomen and back.  Increased pelvic pressure and dull back pain.  A watery or bloody mucus discharge that comes from the vagina.  Leaking of amniotic fluid. This is also known as your "water breaking." It could be a slow trickle or a gush. Let your health care provider know if it has a color or strange odor. If you have any of these signs, call your health care provider right away, even if it is before your due date. Follow these instructions at home: Medicines  Follow your health care provider's instructions regarding medicine use. Specific medicines may be either safe or unsafe to take during pregnancy.  Take a prenatal vitamin that contains at least 600 micrograms (mcg) of folic acid.  If you develop constipation, try taking a stool softener if your health care provider approves. Eating and drinking   Eat a balanced diet that includes fresh fruits and vegetables, whole grains, good sources of protein such as meat, eggs, or tofu,  and low-fat dairy. Your health care provider will help you determine the amount of weight gain that is right for you.  Avoid raw meat and uncooked cheese. These carry germs that can cause birth defects in the baby.  If you have low calcium intake from food, talk to your health care provider about whether you should take a daily calcium supplement.  Eat four or five small meals rather than three large meals a day.  Limit foods that are high in fat and processed sugars, such as fried and sweet foods.  To prevent constipation: ? Drink enough fluid to keep your urine clear or pale yellow. ? Eat foods that are high in fiber, such as fresh fruits and vegetables, whole grains, and beans. Activity  Exercise only as directed by your health care provider. Most women can continue their usual exercise routine during pregnancy. Try to exercise for 30 minutes at least 5 days a week. Stop exercising if you experience uterine contractions.  Avoid heavy lifting.  Do   not exercise in extreme heat or humidity, or at high altitudes.  Wear low-heel, comfortable shoes.  Practice good posture.  You may continue to have sex unless your health care provider tells you otherwise. Relieving pain and discomfort  Take frequent breaks and rest with your legs elevated if you have leg cramps or low back pain.  Take warm sitz baths to soothe any pain or discomfort caused by hemorrhoids. Use hemorrhoid cream if your health care provider approves.  Wear a good support bra to prevent discomfort from breast tenderness.  If you develop varicose veins: ? Wear support pantyhose or compression stockings as told by your healthcare provider. ? Elevate your feet for 15 minutes, 3-4 times a day. Prenatal care  Write down your questions. Take them to your prenatal visits.  Keep all your prenatal visits as told by your health care provider. This is important. Safety  Wear your seat belt at all times when driving.  Make  a list of emergency phone numbers, including numbers for family, friends, the hospital, and police and fire departments. General instructions  Avoid cat litter boxes and soil used by cats. These carry germs that can cause birth defects in the baby. If you have a cat, ask someone to clean the litter box for you.  Do not travel far distances unless it is absolutely necessary and only with the approval of your health care provider.  Do not use hot tubs, steam rooms, or saunas.  Do not drink alcohol.  Do not use any products that contain nicotine or tobacco, such as cigarettes and e-cigarettes. If you need help quitting, ask your health care provider.  Do not use any medicinal herbs or unprescribed drugs. These chemicals affect the formation and growth of the baby.  Do not douche or use tampons or scented sanitary pads.  Do not cross your legs for long periods of time.  To prepare for the arrival of your baby: ? Take prenatal classes to understand, practice, and ask questions about labor and delivery. ? Make a trial run to the hospital. ? Visit the hospital and tour the maternity area. ? Arrange for maternity or paternity leave through employers. ? Arrange for family and friends to take care of pets while you are in the hospital. ? Purchase a rear-facing car seat and make sure you know how to install it in your car. ? Pack your hospital bag. ? Prepare the baby's nursery. Make sure to remove all pillows and stuffed animals from the baby's crib to prevent suffocation.  Visit your dentist if you have not gone during your pregnancy. Use a soft toothbrush to brush your teeth and be gentle when you floss. Contact a health care provider if:  You are unsure if you are in labor or if your water has broken.  You become dizzy.  You have mild pelvic cramps, pelvic pressure, or nagging pain in your abdominal area.  You have lower back pain.  You have persistent nausea, vomiting, or diarrhea.   You have an unusual or bad smelling vaginal discharge.  You have pain when you urinate. Get help right away if:  Your water breaks before 37 weeks.  You have regular contractions less than 5 minutes apart before 37 weeks.  You have a fever.  You are leaking fluid from your vagina.  You have spotting or bleeding from your vagina.  You have severe abdominal pain or cramping.  You have rapid weight loss or weight gain.  You have   shortness of breath with chest pain.  You notice sudden or extreme swelling of your face, hands, ankles, feet, or legs.  Your baby makes fewer than 10 movements in 2 hours.  You have severe headaches that do not go away when you take medicine.  You have vision changes. Summary  The third trimester is from week 28 through week 40, months 7 through 9. The third trimester is a time when the unborn baby (fetus) is growing rapidly.  During the third trimester, your discomfort may increase as you and your baby continue to gain weight. You may have abdominal, leg, and back pain, sleeping problems, and an increased need to urinate.  During the third trimester your breasts will keep growing and they will continue to become tender. A yellow fluid (colostrum) may leak from your breasts. This is the first milk you are producing for your baby.  False labor is a condition in which you feel small, irregular tightenings of the muscles in the womb (contractions) that eventually go away. These are called Braxton Hicks contractions. Contractions may last for hours, days, or even weeks before true labor sets in.  Signs of labor can include: abdominal cramps; regular contractions that start at 10 minutes apart and become stronger and more frequent with time; watery or bloody mucus discharge that comes from the vagina; increased pelvic pressure and dull back pain; and leaking of amniotic fluid. This information is not intended to replace advice given to you by your health  care provider. Make sure you discuss any questions you have with your health care provider. Document Released: 12/12/2000 Document Revised: 04/10/2018 Document Reviewed: 01/24/2016 Elsevier Patient Education  2020 [employer]  

## 2018-10-09 NOTE — Progress Notes (Signed)
Routine Prenatal Care Visit  Subjective  Carolyn Dawson is a 28 y.o. G3P2002 at [redacted]w[redacted]d being seen today for ongoing prenatal care.  She is currently monitored for the following issues for this high-risk pregnancy and has Supervision of high risk pregnancy, antepartum; Trichomoniasis; and Previous cesarean section complicating pregnancy on their problem list.  ----------------------------------------------------------------------------------- Patient reports some pelvic pressure.    . Vag. Bleeding: None.  Movement: Present. Leaking Fluid denies.  ----------------------------------------------------------------------------------- The following portions of the patient's history were reviewed and updated as appropriate: allergies, current medications, past family history, past medical history, past social history, past surgical history and problem list. Problem list updated.  Objective  Blood pressure 122/74, weight 154 lb (69.9 kg), last menstrual period 03/09/2018. Pregravid weight 119 lb (54 kg) Total Weight Gain 35 lb (15.9 kg) Urinalysis: Urine Protein    Urine Glucose    Fetal Status: Fetal Heart Rate (bpm): 135 Fundal Height: 31 cm Movement: Present     General:  Alert, oriented and cooperative. Patient is in no acute distress.  Skin: Skin is warm and dry. No rash noted.   Cardiovascular: Normal heart rate noted  Respiratory: Normal respiratory effort, no problems with respiration noted  Abdomen: Soft, gravid, appropriate for gestational age. Pain/Pressure: pelvic pressure     Pelvic:  Cervical exam deferred        Extremities: Normal range of motion.  Edema: None  Mental Status: Normal mood and affect. Normal behavior. Normal judgment and thought content.   Assessment   28 y.o. [id number] at [redacted]w[redacted]d by  12/14/2018, by Last Menstrual Period presenting for routine prenatal visit  Plan   Pregnancy#3 Problems (from 03/09/18 to present)    Problem Noted Resolved   Trichomoniasis 04/30/2018  by Farrel Conners, CNM No   Overview Signed 05/21/2018  9:46 AM by Conard Novak, MD    - dx 4/29, flagyl not tolerated - re-prescribe 2nd trimester [ ]       Previous cesarean section complicating pregnancy 04/30/2018 by 05/02/2018, CNM No   Overview Signed 04/30/2018  5:41 PM by 05/02/2018, CNM    Arrest of dilation 2015      Supervision of high risk pregnancy, antepartum 04/16/2018 by 04/18/2018, CNM No   Overview Addendum 09/20/2018  7:37 PM by 09/22/2018, CNM    Clinic [facility] Prenatal Labs  Dating LMP=7wk Farrel Conners Blood type: A/Positive/-- (04/14 0940)   Genetic Screen NIPS: Negative XX Antibody:Negative (04/14 0940)  Anatomic 05-23-1991 Complete 07/16/2018 Rubella: 2.42 (04/14 0940) Varicella: Immune  GTT Early:               Third trimester:  RPR: Non Reactive (04/14 0940)   Rhogam N/A HBsAg: Negative (04/14 0940)   TDaP vaccine                       Flu Shot: HIV: Non Reactive (04/14 0940)   Baby Food   Breast  / BT                           GBS:   Contraception uncertain Pap: 04/15/2018 NILM  CBB     CS/VBAC SVD 2012, C/S 2015   Support Person Christopher              Preterm labor symptoms and general obstetric precautions including but not limited to vaginal bleeding, contractions, leaking of fluid and fetal movement were reviewed in detail with the patient.  Please refer to After Visit Summary for other counseling recommendations.   Return in about 2 weeks (around 10/23/2018) for rob and TDAP.  Rod Can, CNM 10/09/2018 4:31 PM

## 2018-10-21 ENCOUNTER — Telehealth: Payer: Self-pay

## 2018-10-21 NOTE — Telephone Encounter (Signed)
Spoke w/pt. She described having a yellow snot looking d/c sometimes when she wipes (denies odor,itching).  Advised normal to begin seeing this at this point in her pregancy. Can be the beginnings of losing her mucus plug. Patient has apt in 2 days and will discuss any further concerns with provider at that apt.

## 2018-10-21 NOTE — Telephone Encounter (Signed)
Patient states she is having some intermittent d/c. Inquiring if she can get an exam/check up for that. Cb#[phone].

## 2018-10-23 ENCOUNTER — Other Ambulatory Visit: Payer: Self-pay

## 2018-10-23 ENCOUNTER — Ambulatory Visit (INDEPENDENT_AMBULATORY_CARE_PROVIDER_SITE_OTHER): Payer: Medicaid Other | Admitting: Advanced Practice Midwife

## 2018-10-23 ENCOUNTER — Encounter: Payer: Self-pay | Admitting: Advanced Practice Midwife

## 2018-10-23 VITALS — BP 118/74 | Wt 153.0 lb

## 2018-10-23 DIAGNOSIS — Z3A32 32 weeks gestation of pregnancy: Secondary | ICD-10-CM

## 2018-10-23 DIAGNOSIS — O34219 Maternal care for unspecified type scar from previous cesarean delivery: Secondary | ICD-10-CM

## 2018-10-23 NOTE — Progress Notes (Signed)
No vb. No lof.  

## 2018-10-23 NOTE — Progress Notes (Signed)
Routine Prenatal Care Visit  Subjective  Carolyn Dawson is a 28 y.o. G3P2002 at [redacted]w[redacted]d being seen today for ongoing prenatal care.  She is currently monitored for the following issues for this high-risk pregnancy and has Supervision of high risk pregnancy, antepartum; Trichomoniasis; and Previous cesarean section complicating pregnancy on their problem list.  ----------------------------------------------------------------------------------- Patient reports strong smelling urine sometimes.   Contractions: Not present. Vag. Bleeding: None.  Movement: Present. Leaking Fluid denies.  ----------------------------------------------------------------------------------- The following portions of the patient's history were reviewed and updated as appropriate: allergies, current medications, past family history, past medical history, past social history, past surgical history and problem list. Problem list updated.  Objective  Blood pressure 118/74, weight 153 lb (69.4 kg), last menstrual period 03/09/2018. Pregravid weight 119 lb (54 kg) Total Weight Gain 34 lb (15.4 kg) Urinalysis: Urine Protein    Urine Glucose    Urine long dip normal  Fetal Status: Fetal Heart Rate (bpm): 147 Fundal Height: 32 cm Movement: Present     General:  Alert, oriented and cooperative. Patient is in no acute distress.  Skin: Skin is warm and dry. No rash noted.   Cardiovascular: Normal heart rate noted  Respiratory: Normal respiratory effort, no problems with respiration noted  Abdomen: Soft, gravid, appropriate for gestational age. Pain/Pressure: Absent     Pelvic:  Cervical exam deferred        Extremities: Normal range of motion.  Edema: None  Mental Status: Normal mood and affect. Normal behavior. Normal judgment and thought content.   Assessment   28 y.o. [id number] at [redacted]w[redacted]d by  12/14/2018, by Last Menstrual Period presenting for routine prenatal visit  Plan   Pregnancy#3 Problems (from 03/09/18 to present)    Problem Noted Resolved   Trichomoniasis 04/30/2018 by Dalia Heading, CNM No   Overview Signed 05/21/2018  9:46 AM by Will Bonnet, MD    - dx 4/29, flagyl not tolerated - re-prescribe 2nd trimester [ ]       Previous cesarean section complicating pregnancy 6/96/7893 by Dalia Heading, CNM No   Overview Signed 04/30/2018  5:41 PM by Dalia Heading, CNM    Arrest of dilation 2015      Supervision of high risk pregnancy, antepartum 04/16/2018 by Rod Can, CNM No   Overview Addendum 09/20/2018  7:37 PM by Dalia Heading, [facility] Prenatal Labs  Dating LMP=7wk [place] Blood type: A/Positive/-- (04/14 0940)   Genetic Screen NIPS: Negative XX Antibody:Negative (04/14 0940)  Anatomic [place] Complete 07/16/2018 Rubella: 2.42 (04/14 0940) Varicella: Immune  GTT Early:               Third trimester:  RPR: Non Reactive (04/14 0940)   Rhogam N/A HBsAg: Negative (04/14 0940)   TDaP vaccine                       Flu Shot: HIV: Non Reactive (04/14 0940)   Baby Food   Breast  / BT                           GBS:   Contraception  Pap: 04/15/2018 NILM  CBB     CS/VBAC SVD 2012, C/S 2015   Support Person Christopher              Preterm labor symptoms and general obstetric precautions including but not limited to vaginal bleeding, contractions, leaking of fluid and fetal movement were reviewed  in detail with the patient.    Return in about 2 weeks (around 11/06/2018) for rob/repeat CBC for platelets.  Tresea Mall, CNM 10/23/2018 4:36 PM

## 2018-11-04 ENCOUNTER — Telehealth: Payer: Self-pay

## 2018-11-04 NOTE — Telephone Encounter (Signed)
Pt calling c/o having trouble c her legs feeling weak; ? Circulation.  [phone]  Pt just went for a walk and they feel a little better but still weak.  Propping them up doesn't help.  They feel achey which was adv may take e.s. tylenol if wishes.  TX'd to JP to schedule appt for tomorrow.  Tried calling this am - vm not set up.

## 2018-11-05 ENCOUNTER — Encounter: Payer: Medicaid Other | Admitting: Advanced Practice Midwife

## 2018-11-06 ENCOUNTER — Encounter: Payer: Medicaid Other | Admitting: Advanced Practice Midwife

## 2018-11-13 ENCOUNTER — Other Ambulatory Visit: Payer: Self-pay

## 2018-11-13 ENCOUNTER — Telehealth: Payer: Self-pay

## 2018-11-13 ENCOUNTER — Ambulatory Visit (INDEPENDENT_AMBULATORY_CARE_PROVIDER_SITE_OTHER): Payer: Medicaid Other | Admitting: Advanced Practice Midwife

## 2018-11-13 ENCOUNTER — Encounter: Payer: Self-pay | Admitting: Advanced Practice Midwife

## 2018-11-13 VITALS — BP 122/76 | Wt 161.0 lb

## 2018-11-13 DIAGNOSIS — O34219 Maternal care for unspecified type scar from previous cesarean delivery: Secondary | ICD-10-CM

## 2018-11-13 DIAGNOSIS — Z3A35 35 weeks gestation of pregnancy: Secondary | ICD-10-CM

## 2018-11-13 NOTE — Progress Notes (Signed)
Routine Prenatal Care Visit  Subjective  Carolyn Dawson is a 28 y.o. G3P2002 at [redacted]w[redacted]d being seen today for ongoing prenatal care.  She is currently monitored for the following issues for this high-risk pregnancy and has Supervision of high risk pregnancy, antepartum; Trichomoniasis; and Previous cesarean section complicating pregnancy on their problem list.  ----------------------------------------------------------------------------------- Patient reports some leg weakness/heaviness which resolves after she gets up from sitting and moves around a bit. She has been having trouble getting comfortable sleeping.   Contractions: Not present. Vag. Bleeding: None.  Movement: Present. Leaking Fluid denies.  ----------------------------------------------------------------------------------- The following portions of the patient's history were reviewed and updated as appropriate: allergies, current medications, past family history, past medical history, past social history, past surgical history and problem list. Problem list updated.  Objective  Blood pressure 122/76, weight 161 lb (73 kg), last menstrual period 03/09/2018. Pregravid weight 119 lb (54 kg) Total Weight Gain 42 lb (19.1 kg) Urinalysis: Urine Protein    Urine Glucose    Fetal Status: Fetal Heart Rate (bpm): 139 Fundal Height: 35 cm Movement: Present     General:  Alert, oriented and cooperative. Patient is in no acute distress.  Skin: Skin is warm and dry. No rash noted.   Cardiovascular: Normal heart rate noted  Respiratory: Normal respiratory effort, no problems with respiration noted  Abdomen: Soft, gravid, appropriate for gestational age. Pain/Pressure: Absent     Pelvic:  Cervical exam deferred        Extremities: Normal range of motion.  Edema: None  Mental Status: Normal mood and affect. Normal behavior. Normal judgment and thought content.   Assessment   28 y.o. [id number] at [redacted]w[redacted]d by  12/14/2018, by Last Menstrual Period  presenting for routine prenatal visit  Plan   Pregnancy#3 Problems (from 03/09/18 to present)    Problem Noted Resolved   Trichomoniasis 04/30/2018 by Farrel Conners, CNM No   Overview Signed 05/21/2018  9:46 AM by Conard Novak, MD    - dx 4/29, flagyl not tolerated - re-prescribe 2nd trimester [ ]       Previous cesarean section complicating pregnancy 04/30/2018 by 05/02/2018, CNM No   Overview Signed 04/30/2018  5:41 PM by 05/02/2018, CNM    Arrest of dilation 2015      Supervision of high risk pregnancy, antepartum 04/16/2018 by 04/18/2018, CNM No   Overview Addendum 09/20/2018  7:37 PM by 09/22/2018, CNM    Clinic [facility] Prenatal Labs  Dating LMP=7wk Farrel Conners Blood type: A/Positive/-- (04/14 0940)   Genetic Screen NIPS: Negative XX Antibody:Negative (04/14 0940)  Anatomic 05-23-1991 Complete 07/16/2018 Rubella: 2.42 (04/14 0940) Varicella: Immune  GTT Early:               Third trimester:  RPR: Non Reactive (04/14 0940)   Rhogam N/A HBsAg: Negative (04/14 0940)   TDaP vaccine                       Flu Shot: HIV: Non Reactive (04/14 0940)   Baby Food   Breast  / BT                           GBS:   Contraception  Pap: 04/15/2018 NILM  CBB     CS/VBAC SVD 2012, C/S 2015   Support Person Christopher              Preterm labor symptoms and general obstetric precautions  including but not limited to vaginal bleeding, contractions, leaking of fluid and fetal movement were reviewed in detail with the patient. Sleep: epsom salt soak before bed, pillows for comfort Leg weakness: get regular exercise   Return in about 1 week (around 11/20/2018) for rob.  Rod Can, CNM 11/13/2018 4:13 PM

## 2018-11-13 NOTE — Progress Notes (Signed)
No vb. No lof.  

## 2018-11-14 LAB — CBC WITH DIFFERENTIAL/PLATELET
Basophils Absolute: 0 10*3/uL (ref 0.0–0.2)
Basos: 0 %
EOS (ABSOLUTE): 0 10*3/uL (ref 0.0–0.4)
Eos: 1 %
Hematocrit: 32.9 % — ABNORMAL LOW (ref 34.0–46.6)
Hemoglobin: 10.8 g/dL — ABNORMAL LOW (ref 11.1–15.9)
Immature Grans (Abs): 0 10*3/uL (ref 0.0–0.1)
Immature Granulocytes: 1 %
Lymphocytes Absolute: 1.1 10*3/uL (ref 0.7–3.1)
Lymphs: 17 %
MCH: 27.8 pg (ref 26.6–33.0)
MCHC: 32.8 g/dL (ref 31.5–35.7)
MCV: 85 fL (ref 79–97)
Monocytes Absolute: 0.5 10*3/uL (ref 0.1–0.9)
Monocytes: 7 %
Neutrophils Absolute: 4.9 10*3/uL (ref 1.4–7.0)
Neutrophils: 74 %
Platelets: 115 10*3/uL — ABNORMAL LOW (ref 150–450)
RBC: 3.89 x10E6/uL (ref 3.77–5.28)
RDW: 12.1 % (ref 11.7–15.4)
WBC: 6.6 10*3/uL (ref 3.4–10.8)

## 2018-11-20 ENCOUNTER — Encounter: Payer: Self-pay | Admitting: Maternal Newborn

## 2018-11-20 ENCOUNTER — Ambulatory Visit (INDEPENDENT_AMBULATORY_CARE_PROVIDER_SITE_OTHER): Payer: Medicaid Other | Admitting: Maternal Newborn

## 2018-11-20 ENCOUNTER — Other Ambulatory Visit: Payer: Self-pay

## 2018-11-20 ENCOUNTER — Other Ambulatory Visit (HOSPITAL_COMMUNITY)
Admission: RE | Admit: 2018-11-20 | Discharge: 2018-11-20 | Disposition: A | Discharge status: Home / Self Care | Payer: Medicaid Other | Source: Ambulatory Visit | Attending: Advanced Practice Midwife | Admitting: Advanced Practice Midwife

## 2018-11-20 VITALS — BP 120/60 | Wt 165.0 lb

## 2018-11-20 DIAGNOSIS — Z3A36 36 weeks gestation of pregnancy: Secondary | ICD-10-CM

## 2018-11-20 DIAGNOSIS — O34219 Maternal care for unspecified type scar from previous cesarean delivery: Secondary | ICD-10-CM

## 2018-11-20 DIAGNOSIS — O0993 Supervision of high risk pregnancy, unspecified, third trimester: Secondary | ICD-10-CM | POA: Insufficient documentation

## 2018-11-20 NOTE — Patient Instructions (Signed)
Third Trimester of Pregnancy The third trimester is from week 28 through week 40 (months 7 through 9). The third trimester is a time when the unborn baby (fetus) is growing rapidly. At the end of the ninth month, the fetus is about 20 inches in length and weighs 6-10 pounds. Body changes during your third trimester Your body will continue to go through many changes during pregnancy. The changes vary from woman to woman. During the third trimester:  Your weight will continue to increase. You can expect to gain 25-35 pounds (11-16 kg) by the end of the pregnancy.  You may begin to get stretch marks on your hips, abdomen, and breasts.  You may urinate more often because the fetus is moving lower into your pelvis and pressing on your bladder.  You may develop or continue to have heartburn. This is caused by increased hormones that slow down muscles in the digestive tract.  You may develop or continue to have constipation because increased hormones slow digestion and cause the muscles that push waste through your intestines to relax.  You may develop hemorrhoids. These are swollen veins (varicose veins) in the rectum that can itch or be painful.  You may develop swollen, bulging veins (varicose veins) in your legs.  You may have increased body aches in the pelvis, back, or thighs. This is due to weight gain and increased hormones that are relaxing your joints.  You may have changes in your hair. These can include thickening of your hair, rapid growth, and changes in texture. Some women also have hair loss during or after pregnancy, or hair that feels dry or thin. Your hair will most likely return to normal after your baby is born.  Your breasts will continue to grow and they will continue to become tender. A yellow fluid (colostrum) may leak from your breasts. This is the first milk you are producing for your baby.  Your belly button may stick out.  You may notice more swelling in your hands,  face, or ankles.  You may have increased tingling or numbness in your hands, arms, and legs. The skin on your belly may also feel numb.  You may feel short of breath because of your expanding uterus.  You may have more problems sleeping. This can be caused by the size of your belly, increased need to urinate, and an increase in your body's metabolism.  You may notice the fetus "dropping," or moving lower in your abdomen (lightening).  You may have increased vaginal discharge.  You may notice your joints feel loose and you may have pain around your pelvic bone. What to expect at prenatal visits You will have prenatal exams every 2 weeks until week 36. Then you will have weekly prenatal exams. During a routine prenatal visit:  You will be weighed to make sure you and the baby are growing normally.  Your blood pressure will be taken.  Your abdomen will be measured to track your baby's growth.  The fetal heartbeat will be listened to.  Any test results from the previous visit will be discussed.  You may have a cervical check near your due date to see if your cervix has softened or thinned (effaced).  You will be tested for Group B streptococcus. This happens between 35 and 37 weeks. Your health care provider may ask you:  What your birth plan is.  How you are feeling.  If you are feeling the baby move.  If you have had any abnormal   symptoms, such as leaking fluid, bleeding, severe headaches, or abdominal cramping.  If you are using any tobacco products, including cigarettes, chewing tobacco, and electronic cigarettes.  If you have any questions. Other tests or screenings that may be performed during your third trimester include:  Blood tests that check for low iron levels (anemia).  Fetal testing to check the health, activity level, and growth of the fetus. Testing is done if you have certain medical conditions or if there are problems during the pregnancy.  Nonstress test  (NST). This test checks the health of your baby to make sure there are no signs of problems, such as the baby not getting enough oxygen. During this test, a belt is placed around your belly. The baby is made to move, and its heart rate is monitored during movement. What is false labor? False labor is a condition in which you feel small, irregular tightenings of the muscles in the womb (contractions) that usually go away with rest, changing position, or drinking water. These are called Braxton Hicks contractions. Contractions may last for hours, days, or even weeks before true labor sets in. If contractions come at regular intervals, become more frequent, increase in intensity, or become painful, you should see your health care provider. What are the signs of labor?  Abdominal cramps.  Regular contractions that start at 10 minutes apart and become stronger and more frequent with time.  Contractions that start on the top of the uterus and spread down to the lower abdomen and back.  Increased pelvic pressure and dull back pain.  A watery or bloody mucus discharge that comes from the vagina.  Leaking of amniotic fluid. This is also known as your "water breaking." It could be a slow trickle or a gush. Let your health care provider know if it has a color or strange odor. If you have any of these signs, call your health care provider right away, even if it is before your due date. Follow these instructions at home: Medicines  Follow your health care provider's instructions regarding medicine use. Specific medicines may be either safe or unsafe to take during pregnancy.  Take a prenatal vitamin that contains at least 600 micrograms (mcg) of folic acid.  If you develop constipation, try taking a stool softener if your health care provider approves. Eating and drinking   Eat a balanced diet that includes fresh fruits and vegetables, whole grains, good sources of protein such as meat, eggs, or tofu,  and low-fat dairy. Your health care provider will help you determine the amount of weight gain that is right for you.  Avoid raw meat and uncooked cheese. These carry germs that can cause birth defects in the baby.  If you have low calcium intake from food, talk to your health care provider about whether you should take a daily calcium supplement.  Eat four or five small meals rather than three large meals a day.  Limit foods that are high in fat and processed sugars, such as fried and sweet foods.  To prevent constipation: ? Drink enough fluid to keep your urine clear or pale yellow. ? Eat foods that are high in fiber, such as fresh fruits and vegetables, whole grains, and beans. Activity  Exercise only as directed by your health care provider. Most women can continue their usual exercise routine during pregnancy. Try to exercise for 30 minutes at least 5 days a week. Stop exercising if you experience uterine contractions.  Avoid heavy lifting.  Do   not exercise in extreme heat or humidity, or at high altitudes.  Wear low-heel, comfortable shoes.  Practice good posture.  You may continue to have sex unless your health care provider tells you otherwise. Relieving pain and discomfort  Take frequent breaks and rest with your legs elevated if you have leg cramps or low back pain.  Take warm sitz baths to soothe any pain or discomfort caused by hemorrhoids. Use hemorrhoid cream if your health care provider approves.  Wear a good support bra to prevent discomfort from breast tenderness.  If you develop varicose veins: ? Wear support pantyhose or compression stockings as told by your healthcare provider. ? Elevate your feet for 15 minutes, 3-4 times a day. Prenatal care  Write down your questions. Take them to your prenatal visits.  Keep all your prenatal visits as told by your health care provider. This is important. Safety  Wear your seat belt at all times when driving.  Make  a list of emergency phone numbers, including numbers for family, friends, the hospital, and police and fire departments. General instructions  Avoid cat litter boxes and soil used by cats. These carry germs that can cause birth defects in the baby. If you have a cat, ask someone to clean the litter box for you.  Do not travel far distances unless it is absolutely necessary and only with the approval of your health care provider.  Do not use hot tubs, steam rooms, or saunas.  Do not drink alcohol.  Do not use any products that contain nicotine or tobacco, such as cigarettes and e-cigarettes. If you need help quitting, ask your health care provider.  Do not use any medicinal herbs or unprescribed drugs. These chemicals affect the formation and growth of the baby.  Do not douche or use tampons or scented sanitary pads.  Do not cross your legs for long periods of time.  To prepare for the arrival of your baby: ? Take prenatal classes to understand, practice, and ask questions about labor and delivery. ? Make a trial run to the hospital. ? Visit the hospital and tour the maternity area. ? Arrange for maternity or paternity leave through employers. ? Arrange for family and friends to take care of pets while you are in the hospital. ? Purchase a rear-facing car seat and make sure you know how to install it in your car. ? Pack your hospital bag. ? Prepare the baby's nursery. Make sure to remove all pillows and stuffed animals from the baby's crib to prevent suffocation.  Visit your dentist if you have not gone during your pregnancy. Use a soft toothbrush to brush your teeth and be gentle when you floss. Contact a health care provider if:  You are unsure if you are in labor or if your water has broken.  You become dizzy.  You have mild pelvic cramps, pelvic pressure, or nagging pain in your abdominal area.  You have lower back pain.  You have persistent nausea, vomiting, or diarrhea.   You have an unusual or bad smelling vaginal discharge.  You have pain when you urinate. Get help right away if:  Your water breaks before 37 weeks.  You have regular contractions less than 5 minutes apart before 37 weeks.  You have a fever.  You are leaking fluid from your vagina.  You have spotting or bleeding from your vagina.  You have severe abdominal pain or cramping.  You have rapid weight loss or weight gain.  You have   shortness of breath with chest pain.  You notice sudden or extreme swelling of your face, hands, ankles, feet, or legs.  Your baby makes fewer than 10 movements in 2 hours.  You have severe headaches that do not go away when you take medicine.  You have vision changes. Summary  The third trimester is from week 28 through week 40, months 7 through 9. The third trimester is a time when the unborn baby (fetus) is growing rapidly.  During the third trimester, your discomfort may increase as you and your baby continue to gain weight. You may have abdominal, leg, and back pain, sleeping problems, and an increased need to urinate.  During the third trimester your breasts will keep growing and they will continue to become tender. A yellow fluid (colostrum) may leak from your breasts. This is the first milk you are producing for your baby.  False labor is a condition in which you feel small, irregular tightenings of the muscles in the womb (contractions) that eventually go away. These are called Braxton Hicks contractions. Contractions may last for hours, days, or even weeks before true labor sets in.  Signs of labor can include: abdominal cramps; regular contractions that start at 10 minutes apart and become stronger and more frequent with time; watery or bloody mucus discharge that comes from the vagina; increased pelvic pressure and dull back pain; and leaking of amniotic fluid. This information is not intended to replace advice given to you by your health  care provider. Make sure you discuss any questions you have with your health care provider. Document Released: 12/12/2000 Document Revised: 04/10/2018 Document Reviewed: 01/24/2016 Elsevier Patient Education  2020 [employer]  

## 2018-11-20 NOTE — Progress Notes (Signed)
Routine Prenatal Care Visit  Subjective  Carolyn Dawson is a 28 y.o. G3P2002 at [redacted]w[redacted]d being seen today for ongoing prenatal care.  She is currently monitored for the following issues for this high-risk pregnancy and has Supervision of high risk pregnancy, antepartum; Trichomoniasis; and Previous cesarean section complicating pregnancy on their problem list.  ----------------------------------------------------------------------------------- Patient reports that she is frequently chewing ice.   Contractions: Not present. Vag. Bleeding: None.  Movement: Present. No leaking of fluid.  ----------------------------------------------------------------------------------- The following portions of the patient's history were reviewed and updated as appropriate: allergies, current medications, past family history, past medical history, past social history, past surgical history and problem list. Problem list updated.   Objective  Blood pressure 120/60, weight 165 lb (74.8 kg), last menstrual period 03/09/2018. Pregravid weight 119 lb (54 kg) Total Weight Gain 46 lb (20.9 kg)  Fetal Status: Fetal Heart Rate (bpm): 142 Fundal Height: 36 cm Movement: Present     General:  Alert, oriented and cooperative. Patient is in no acute distress.  Skin: Skin is warm and dry. No rash noted.   Cardiovascular: Normal heart rate noted  Respiratory: Normal respiratory effort, no problems with respiration noted  Abdomen: Soft, gravid, appropriate for gestational age. Pain/Pressure: Absent     Pelvic:  Cervical exam performed Dilation: 1.5 Effacement (%): 50 Station: -3  Extremities: Normal range of motion.  Edema: None  Mental Status: Normal mood and affect. Normal behavior. Normal judgment and thought content.     Assessment   28 y.o. D3O6712 at [redacted]w[redacted]d EDD 12/14/2018, by Last Menstrual Period presenting for a routine prenatal visit.  Plan   Pregnancy#3 Problems (from 03/09/18 to present)    Problem Noted  Resolved   Trichomoniasis 04/30/2018 by Farrel Conners, CNM No   Overview Signed 05/21/2018  9:46 AM by Conard Novak, MD    - dx 4/29, flagyl not tolerated - re-prescribe 2nd trimester [ ]       Previous cesarean section complicating pregnancy 04/30/2018 by 05/02/2018, CNM No   Overview Signed 04/30/2018  5:41 PM by 05/02/2018, CNM    Arrest of dilation 2015      Supervision of high risk pregnancy, antepartum 04/16/2018 by 04/18/2018, CNM No   Overview Addendum 09/20/2018  7:37 PM by 09/22/2018, CNM    Clinic [facility] Prenatal Labs  Dating LMP=7wk Farrel Conners Blood type: A/Positive/-- (04/14 0940)   Genetic Screen NIPS: Negative XX Antibody:Negative (04/14 0940)  Anatomic 05-23-1991 Complete 07/16/2018 Rubella: 2.42 (04/14 0940) Varicella: Immune  GTT Early:               Third trimester:  RPR: Non Reactive (04/14 0940)   Rhogam N/A HBsAg: Negative (04/14 0940)   TDaP vaccine                       Flu Shot: HIV: Non Reactive (04/14 0940)   Baby Food   Breast  / BT                           GBS:   Contraception  Pap: 04/15/2018 NILM  CBB     CS/VBAC SVD 2012, C/S 2015   Support Person Christopher           GBS and Aptima today.   Recommended OTC iron supplement such as SlowFe since she is not currently taking prenatal vitamins with iron.   Preterm labor symptoms and general obstetric  precautions were reviewed.  Please refer to After Visit Summary for other counseling recommendations.   Return in about 1 week (around 11/27/2018) for ROB.  Avel Sensor, CNM 11/20/2018  3:41 PM

## 2018-11-20 NOTE — Progress Notes (Signed)
ROB No concerns GBS/Aptima  Denies lof, no vb, Good FM 

## 2018-11-22 LAB — STREP GP B NAA: Strep Gp B NAA: NEGATIVE

## 2018-11-25 LAB — CERVICOVAGINAL ANCILLARY ONLY
Chlamydia: NEGATIVE
Comment: NEGATIVE
Comment: NORMAL
Neisseria Gonorrhea: NEGATIVE

## 2018-11-26 ENCOUNTER — Ambulatory Visit (INDEPENDENT_AMBULATORY_CARE_PROVIDER_SITE_OTHER): Payer: Medicaid Other | Admitting: Obstetrics and Gynecology

## 2018-11-26 ENCOUNTER — Encounter: Payer: Self-pay | Admitting: Obstetrics and Gynecology

## 2018-11-26 ENCOUNTER — Other Ambulatory Visit: Payer: Self-pay

## 2018-11-26 VITALS — BP 110/62 | Temp 96.0°F | Wt 160.0 lb

## 2018-11-26 DIAGNOSIS — Z3A37 37 weeks gestation of pregnancy: Secondary | ICD-10-CM

## 2018-11-26 DIAGNOSIS — O34219 Maternal care for unspecified type scar from previous cesarean delivery: Secondary | ICD-10-CM

## 2018-11-26 DIAGNOSIS — O0990 Supervision of high risk pregnancy, unspecified, unspecified trimester: Secondary | ICD-10-CM

## 2018-11-26 NOTE — Progress Notes (Signed)
ROB C/o pelvic pressure, some cramping, braxton hicks Denies lof, no vb, Good FM

## 2018-11-26 NOTE — Progress Notes (Signed)
Routine Prenatal Care Visit  Subjective  Carolyn Dawson is a 28 y.o. G3P2002 at [redacted]w[redacted]d being seen today for ongoing prenatal care.  She is currently monitored for the following issues for this high-risk pregnancy and has Supervision of high risk pregnancy, antepartum; Trichomoniasis; and Previous cesarean section complicating pregnancy on their problem list.  ----------------------------------------------------------------------------------- Patient reports backache and difficulty falling asleep. Issue with hemorrhoids.  Contractions: Irregular. Vag. Bleeding: None.  Movement: Present. Denies leaking of fluid.  ----------------------------------------------------------------------------------- The following portions of the patient's history were reviewed and updated as appropriate: allergies, current medications, past family history, past medical history, past social history, past surgical history and problem list. Problem list updated.   Objective  Blood pressure 110/62, temperature (!) 96 F (35.6 C), weight 160 lb (72.6 kg), last menstrual period 03/09/2018. Pregravid weight 119 lb (54 kg) Total Weight Gain 41 lb (18.6 kg) Urinalysis:      Fetal Status: Fetal Heart Rate (bpm): 124 Fundal Height: 38 cm Movement: Present     General:  Alert, oriented and cooperative. Patient is in no acute distress.  Skin: Skin is warm and dry. No rash noted.   Cardiovascular: Normal heart rate noted  Respiratory: Normal respiratory effort, no problems with respiration noted  Abdomen: Soft, gravid, appropriate for gestational age. Pain/Pressure: Present     Pelvic:  Cervical exam deferred        Extremities: Normal range of motion.  Edema: None  Mental Status: Normal mood and affect. Normal behavior. Normal judgment and thought content.     Assessment   28 y.o. [id number] at [redacted]w[redacted]d by  12/14/2018, by Last Menstrual Period presenting for routine prenatal visit  Plan   Pregnancy#3 Problems (from  03/09/18 to present)    Problem Noted Resolved   Trichomoniasis 04/30/2018 by Farrel Conners, CNM No   Overview Signed 05/21/2018  9:46 AM by Conard Novak, MD    - dx 4/29, flagyl not tolerated - re-prescribe 2nd trimester [ ]       Previous cesarean section complicating pregnancy 04/30/2018 by 05/02/2018, CNM No   Overview Signed 04/30/2018  5:41 PM by 05/02/2018, CNM    Arrest of dilation 2015      Supervision of high risk pregnancy, antepartum 04/16/2018 by 04/18/2018, CNM No   Overview Addendum 11/26/2018  9:31 AM by 11/28/2018, MD    [facility]inic [facility] Prenatal Labs  Dating LMP=7wk Natale Milch Blood type: A/Positive/-- (04/14 0940)   Genetic Screen NIPS: Negative XX Antibody:Negative (04/14 0940)  Anatomic 05-[birth year]1991 Complete 07/16/2018 Rubella: 2.42 (04/14 0940) Varicella: Immune  GTT Early:               Third trimester:  RPR: Non Reactive (04/14 0940)   Rhogam N/A HBsAg: Negative (04/14 0940)   TDaP vaccine                       Flu Shot: HIV: Non Reactive (04/14 0940)   Baby Food   Breast  / BT                           05-23[birth year]1991  Contraception undecided Pap: 04/15/2018 NILM  CBB     CS/VBAC SVD 2012, C/S 2015   Support Person 2016              Gestational age appropriate obstetric precautions including but not limited to vaginal bleeding, contractions, leaking of fluid and fetal  movement were reviewed in detail with the patient.    Discussed supportive care of hemorrhoids and back pain.  Discussed breastfeeding.   Return in about 1 week (around 12/03/2018) for [place].  Homero Fellers MD [facility], [facility] 11/26/2018, 9:31 AM

## 2018-12-05 ENCOUNTER — Ambulatory Visit (INDEPENDENT_AMBULATORY_CARE_PROVIDER_SITE_OTHER): Payer: Medicaid Other | Admitting: Obstetrics and Gynecology

## 2018-12-05 ENCOUNTER — Other Ambulatory Visit: Payer: Self-pay

## 2018-12-05 ENCOUNTER — Encounter: Payer: Self-pay | Admitting: Obstetrics and Gynecology

## 2018-12-05 VITALS — BP 110/70 | Wt 164.0 lb

## 2018-12-05 DIAGNOSIS — O34219 Maternal care for unspecified type scar from previous cesarean delivery: Secondary | ICD-10-CM

## 2018-12-05 DIAGNOSIS — Z23 Encounter for immunization: Secondary | ICD-10-CM | POA: Diagnosis not present

## 2018-12-05 DIAGNOSIS — O0990 Supervision of high risk pregnancy, unspecified, unspecified trimester: Secondary | ICD-10-CM

## 2018-12-05 DIAGNOSIS — Z3A38 38 weeks gestation of pregnancy: Secondary | ICD-10-CM

## 2018-12-05 NOTE — Addendum Note (Signed)
Addended by: Raechel Chute on: 12/05/2018 09:30 AM   Modules accepted: Orders

## 2018-12-05 NOTE — Progress Notes (Signed)
ROB C/o pelvic pressure  Denies lof, no vb Good FM Desires a cervical check

## 2018-12-05 NOTE — Progress Notes (Signed)
Routine Prenatal Care Visit  Subjective  Carolyn Dawson is a 28 y.o. G3P2002 at [redacted]w[redacted]d being seen today for ongoing prenatal care.  She is currently monitored for the following issues for this high-risk pregnancy and has Supervision of high risk pregnancy, antepartum; Trichomoniasis; and Previous cesarean section complicating pregnancy on their problem list.  ----------------------------------------------------------------------------------- Patient reports no complaints.   Contractions: Not present. Vag. Bleeding: None.  Movement: Present. Denies leaking of fluid.  ----------------------------------------------------------------------------------- The following portions of the patient's history were reviewed and updated as appropriate: allergies, current medications, past family history, past medical history, past social history, past surgical history and problem list. Problem list updated.   Objective  Blood pressure 110/70, weight 164 lb (74.4 kg), last menstrual period 03/09/2018. Pregravid weight 119 lb (54 kg) Total Weight Gain 45 lb (20.4 kg) Urinalysis:      Fetal Status: Fetal Heart Rate (bpm): 140 Fundal Height: 38 cm Movement: Present  Presentation: Vertex  General:  Alert, oriented and cooperative. Patient is in no acute distress.  Skin: Skin is warm and dry. No rash noted.   Cardiovascular: Normal heart rate noted  Respiratory: Normal respiratory effort, no problems with respiration noted  Abdomen: Soft, gravid, appropriate for gestational age. Pain/Pressure: Absent     Pelvic:  Cervical exam performed Dilation: 1 Effacement (%): 20 Station: -3  Extremities: Normal range of motion.  Edema: None  Mental Status: Normal mood and affect. Normal behavior. Normal judgment and thought content.     Assessment   28 y.o. G3O7564 at [redacted]w[redacted]d by  12/14/2018, by Last Menstrual Period presenting for routine prenatal visit  Plan   Pregnancy#3 Problems (from 03/09/18 to present)    Problem Noted Resolved   Trichomoniasis 04/30/2018 by Dalia Heading, CNM No   Overview Signed 05/21/2018  9:46 AM by Will Bonnet, MD    - dx 4/29, flagyl not tolerated - re-prescribe 2nd trimester [ x]      Previous cesarean section complicating pregnancy 3/32/9518 by Dalia Heading, CNM No   Overview Signed 04/30/2018  5:41 PM by Dalia Heading, CNM    Arrest of dilation 2015      Supervision of high risk pregnancy, antepartum 04/16/2018 by Rod Can, CNM No   Overview Addendum 11/26/2018  9:31 AM by Homero Fellers, MD    [facility]inic [facility] Prenatal Labs  Dating LMP=7wk [place] Blood type: A/Positive/-- (04/14 0940)   Genetic Screen NIPS: Negative XX Antibody:Negative (04/14 0940)  Anatomic [place] Complete 07/16/2018 Rubella: 2.42 (04/14 0940) Varicella: Immune  GTT Early:               Third trimester:  RPR: Non Reactive (04/14 0940)   Rhogam N/A HBsAg: Negative (04/14 0940)   TDaP vaccine                       Flu Shot: HIV: Non Reactive (04/14 0940)   Baby Food   Breast  / BT                           [id number]  Contraception undecided Pap: 04/15/2018 NILM  CBB     CS/VBAC SVD 2012, C/S 2015   Support Person Harrell Gave              Gestational age appropriate obstetric precautions including but not limited to vaginal bleeding, contractions, leaking of fluid and fetal movement were reviewed in detail with the patient.  Membrane sweep performed at maternal request Flu shot given today Tdap given today  Return in about 1 week (around 12/12/2018) for ROB.  Natale Milch MD [facility], [facility] 12/05/2018, 8:29 AM

## 2018-12-08 ENCOUNTER — Other Ambulatory Visit: Payer: Self-pay

## 2018-12-08 ENCOUNTER — Inpatient Hospital Stay: Payer: Medicaid Other | Admitting: Certified Registered Nurse Anesthetist

## 2018-12-08 ENCOUNTER — Inpatient Hospital Stay
Admission: EM | Admit: 2018-12-08 | Discharge: 2018-12-10 | DRG: 806 | Disposition: A | Discharge status: Home / Self Care | Payer: Medicaid Other | Attending: Certified Nurse Midwife | Admitting: Certified Nurse Midwife

## 2018-12-08 DIAGNOSIS — D696 Thrombocytopenia, unspecified: Secondary | ICD-10-CM | POA: Diagnosis present

## 2018-12-08 DIAGNOSIS — Z3A39 39 weeks gestation of pregnancy: Secondary | ICD-10-CM

## 2018-12-08 DIAGNOSIS — D62 Acute posthemorrhagic anemia: Secondary | ICD-10-CM | POA: Diagnosis not present

## 2018-12-08 DIAGNOSIS — Z20828 Contact with and (suspected) exposure to other viral communicable diseases: Secondary | ICD-10-CM | POA: Diagnosis present

## 2018-12-08 DIAGNOSIS — O26893 Other specified pregnancy related conditions, third trimester: Secondary | ICD-10-CM | POA: Diagnosis present

## 2018-12-08 DIAGNOSIS — O0990 Supervision of high risk pregnancy, unspecified, unspecified trimester: Secondary | ICD-10-CM

## 2018-12-08 DIAGNOSIS — O4202 Full-term premature rupture of membranes, onset of labor within 24 hours of rupture: Secondary | ICD-10-CM | POA: Diagnosis not present

## 2018-12-08 DIAGNOSIS — O9081 Anemia of the puerperium: Secondary | ICD-10-CM | POA: Diagnosis present

## 2018-12-08 DIAGNOSIS — O34211 Maternal care for low transverse scar from previous cesarean delivery: Secondary | ICD-10-CM | POA: Diagnosis present

## 2018-12-08 DIAGNOSIS — O9912 Other diseases of the blood and blood-forming organs and certain disorders involving the immune mechanism complicating childbirth: Secondary | ICD-10-CM | POA: Diagnosis present

## 2018-12-08 DIAGNOSIS — A599 Trichomoniasis, unspecified: Secondary | ICD-10-CM

## 2018-12-08 DIAGNOSIS — Z87891 Personal history of nicotine dependence: Secondary | ICD-10-CM | POA: Diagnosis not present

## 2018-12-08 DIAGNOSIS — O34219 Maternal care for unspecified type scar from previous cesarean delivery: Secondary | ICD-10-CM

## 2018-12-08 LAB — CBC
HCT: 35.5 % — ABNORMAL LOW (ref 36.0–46.0)
Hemoglobin: 11.7 g/dL — ABNORMAL LOW (ref 12.0–15.0)
MCH: 27.4 pg (ref 26.0–34.0)
MCHC: 33 g/dL (ref 30.0–36.0)
MCV: 83.1 fL (ref 80.0–100.0)
Platelets: 114 10*3/uL — ABNORMAL LOW (ref 150–400)
RBC: 4.27 MIL/uL (ref 3.87–5.11)
RDW: 12.8 % (ref 11.5–15.5)
WBC: 9.2 10*3/uL (ref 4.0–10.5)
nRBC: 0 % (ref 0.0–0.2)

## 2018-12-08 LAB — URINE DRUG SCREEN, QUALITATIVE (ARMC ONLY)
Amphetamines, Ur Screen: NOT DETECTED
Barbiturates, Ur Screen: NOT DETECTED
Benzodiazepine, Ur Scrn: NOT DETECTED
Cannabinoid 50 Ng, Ur ~~LOC~~: POSITIVE — AB
Cocaine Metabolite,Ur ~~LOC~~: NOT DETECTED
MDMA (Ecstasy)Ur Screen: NOT DETECTED
Methadone Scn, Ur: NOT DETECTED
Opiate, Ur Screen: NOT DETECTED
Phencyclidine (PCP) Ur S: NOT DETECTED
Tricyclic, Ur Screen: NOT DETECTED

## 2018-12-08 LAB — SARS CORONAVIRUS 2 BY RT PCR (HOSPITAL ORDER, PERFORMED IN ~~LOC~~ HOSPITAL LAB): SARS Coronavirus 2: NEGATIVE

## 2018-12-08 LAB — TYPE AND SCREEN
ABO/RH(D): A POS
Antibody Screen: NEGATIVE

## 2018-12-08 MED ORDER — LACTATED RINGERS IV SOLN: 500.0000 mL | INTRAVENOUS | Status: DC | PRN

## 2018-12-08 MED ORDER — PHENYLEPHRINE 40 MCG/ML (10ML) SYRINGE FOR IV PUSH (FOR BLOOD PRESSURE SUPPORT)
80.0000 ug | PREFILLED_SYRINGE | INTRAVENOUS | Status: DC | PRN
  Filled 2018-12-08: qty 10

## 2018-12-08 MED ORDER — SIMETHICONE 80 MG PO CHEW: 80.0000 mg | CHEWABLE_TABLET | ORAL | Status: DC | PRN

## 2018-12-08 MED ORDER — MISOPROSTOL 200 MCG PO TABS
800.0000 ug | ORAL_TABLET | Freq: Once | ORAL | Status: DC | PRN
  Filled 2018-12-08: qty 4

## 2018-12-08 MED ORDER — ONDANSETRON HCL 4 MG PO TABS: 4.0000 mg | ORAL_TABLET | ORAL | Status: DC | PRN

## 2018-12-08 MED ORDER — SENNOSIDES-DOCUSATE SODIUM 8.6-50 MG PO TABS
2.0000 | ORAL_TABLET | ORAL | Status: DC
  Administered 2018-12-10: 2 via ORAL
  Filled 2018-12-08 (×2): qty 2

## 2018-12-08 MED ORDER — BUTORPHANOL TARTRATE 1 MG/ML IJ SOLN
1.0000 mg | INTRAMUSCULAR | Status: DC | PRN
  Administered 2018-12-08 (×2): 1 mg via INTRAVENOUS
  Filled 2018-12-08 (×2): qty 1

## 2018-12-08 MED ORDER — FENTANYL 2.5 MCG/ML W/ROPIVACAINE 0.15% IN NS 100 ML EPIDURAL (ARMC)
EPIDURAL | Status: AC
  Filled 2018-12-08: qty 100

## 2018-12-08 MED ORDER — IBUPROFEN 600 MG PO TABS
600.0000 mg | ORAL_TABLET | Freq: Four times a day (QID) | ORAL | Status: DC
  Administered 2018-12-09 – 2018-12-10 (×5): 600 mg via ORAL
  Filled 2018-12-08 (×6): qty 1

## 2018-12-08 MED ORDER — LIDOCAINE-EPINEPHRINE (PF) 1.5 %-1:200000 IJ SOLN
INTRAMUSCULAR | Status: DC | PRN
  Administered 2018-12-08: 4 mg via PERINEURAL

## 2018-12-08 MED ORDER — BENZOCAINE-MENTHOL 20-0.5 % EX AERO
1.0000 "application " | INHALATION_SPRAY | CUTANEOUS | Status: DC | PRN
  Administered 2018-12-09: 1 via TOPICAL
  Filled 2018-12-08: qty 56

## 2018-12-08 MED ORDER — ONDANSETRON HCL 4 MG/2ML IJ SOLN
4.0000 mg | INTRAMUSCULAR | Status: DC | PRN
  Administered 2018-12-08: 4 mg via INTRAVENOUS

## 2018-12-08 MED ORDER — LIDOCAINE HCL (PF) 1 % IJ SOLN
30.0000 mL | INTRAMUSCULAR | Status: DC | PRN
  Filled 2018-12-08: qty 30

## 2018-12-08 MED ORDER — FERROUS SULFATE 325 (65 FE) MG PO TABS
325.0000 mg | ORAL_TABLET | Freq: Every day | ORAL | Status: DC
  Administered 2018-12-09 – 2018-12-10 (×2): 325 mg via ORAL
  Filled 2018-12-08 (×2): qty 1

## 2018-12-08 MED ORDER — FENTANYL 2.5 MCG/ML W/ROPIVACAINE 0.15% IN NS 100 ML EPIDURAL (ARMC)
EPIDURAL | Status: DC | PRN
  Administered 2018-12-08: 12 mL/h via EPIDURAL

## 2018-12-08 MED ORDER — EPHEDRINE 5 MG/ML INJ
10.0000 mg | INTRAVENOUS | Status: DC | PRN
  Filled 2018-12-08: qty 2

## 2018-12-08 MED ORDER — OXYTOCIN 40 UNITS IN NORMAL SALINE INFUSION - SIMPLE MED
2.5000 [IU]/h | INTRAVENOUS | Status: DC
  Administered 2018-12-08: 19:00:00 2.5 [IU]/h via INTRAVENOUS
  Filled 2018-12-08: qty 1000

## 2018-12-08 MED ORDER — COCONUT OIL OIL
1.0000 "application " | TOPICAL_OIL | Status: DC | PRN
  Administered 2018-12-09: 1 via TOPICAL
  Filled 2018-12-08: qty 120

## 2018-12-08 MED ORDER — DIPHENHYDRAMINE HCL 50 MG/ML IJ SOLN: 12.5000 mg | INTRAMUSCULAR | Status: DC | PRN

## 2018-12-08 MED ORDER — FENTANYL-BUPIVACAINE-NACL 0.5-0.125-0.9 MG/250ML-% EP SOLN: 12.0000 mL/h | EPIDURAL | Status: DC | PRN

## 2018-12-08 MED ORDER — LACTATED RINGERS IV SOLN
INTRAVENOUS | Status: DC
  Administered 2018-12-08: 11:00:00 via INTRAVENOUS

## 2018-12-08 MED ORDER — OXYTOCIN BOLUS FROM INFUSION
500.0000 mL | Freq: Once | INTRAVENOUS | Status: AC
  Administered 2018-12-08: 19:00:00 500 mL via INTRAVENOUS

## 2018-12-08 MED ORDER — LIDOCAINE HCL (CARDIAC) PF 50 MG/5ML IV SOSY
PREFILLED_SYRINGE | INTRAVENOUS | Status: DC | PRN
  Administered 2018-12-08: 3 mL via INTRAVENOUS

## 2018-12-08 MED ORDER — FENTANYL 2.5 MCG/ML W/ROPIVACAINE 0.15% IN NS 100 ML EPIDURAL (ARMC): 12.0000 mL/h | EPIDURAL | Status: DC

## 2018-12-08 MED ORDER — BUPIVACAINE HCL (PF) 0.25 % IJ SOLN
INTRAMUSCULAR | Status: DC | PRN
  Administered 2018-12-08: 4 mL via EPIDURAL

## 2018-12-08 MED ORDER — TERBUTALINE SULFATE 1 MG/ML IJ SOLN: 0.2500 mg | Freq: Once | INTRAMUSCULAR | Status: DC | PRN

## 2018-12-08 MED ORDER — DIBUCAINE (PERIANAL) 1 % EX OINT: 1.0000 "application " | TOPICAL_OINTMENT | CUTANEOUS | Status: DC | PRN

## 2018-12-08 MED ORDER — WITCH HAZEL-GLYCERIN EX PADS: 1.0000 "application " | MEDICATED_PAD | CUTANEOUS | Status: DC | PRN

## 2018-12-08 MED ORDER — PRENATAL MULTIVITAMIN CH
1.0000 | ORAL_TABLET | Freq: Every day | ORAL | Status: DC
  Administered 2018-12-09 – 2018-12-10 (×2): 1 via ORAL
  Filled 2018-12-08 (×3): qty 1

## 2018-12-08 MED ORDER — OXYTOCIN 40 UNITS IN NORMAL SALINE INFUSION - SIMPLE MED
1.0000 m[IU]/min | INTRAVENOUS | Status: DC
  Administered 2018-12-08: 1 m[IU]/min via INTRAVENOUS

## 2018-12-08 MED ORDER — LACTATED RINGERS IV SOLN: 500.0000 mL | Freq: Once | INTRAVENOUS | Status: DC

## 2018-12-08 MED ORDER — ACETAMINOPHEN 325 MG PO TABS
650.0000 mg | ORAL_TABLET | ORAL | Status: DC | PRN
  Administered 2018-12-09 – 2018-12-10 (×4): 650 mg via ORAL
  Filled 2018-12-08 (×5): qty 2

## 2018-12-08 MED ORDER — ONDANSETRON HCL 4 MG/2ML IJ SOLN: 4.0000 mg | INTRAMUSCULAR | Status: DC | PRN

## 2018-12-08 MED ORDER — AMMONIA AROMATIC IN INHA
0.3000 mL | Freq: Once | RESPIRATORY_TRACT | Status: DC | PRN
  Filled 2018-12-08: qty 10

## 2018-12-08 NOTE — Anesthesia Procedure Notes (Signed)
Epidural Patient location during procedure: OB Start time: 12/08/2018 3:02 PM End time: 12/08/2018 3:15 PM  Staffing Anesthesiologist: Molli Barrows, MD Resident/CRNA: Johnna Acosta, CRNA Performed: resident/CRNA   Preanesthetic Checklist Completed: patient identified, site marked, surgical consent, pre-op evaluation, timeout performed, IV checked, risks and benefits discussed and monitors and equipment checked  Epidural Patient position: sitting Prep: ChloraPrep Patient monitoring: heart rate, continuous pulse ox and blood pressure Approach: midline Location: L3-L4 Injection technique: LOR saline  Needle:  Needle type: Tuohy  Needle gauge: 17 G Needle length: 9 cm and 9 Needle insertion depth: 7 cm Catheter type: closed end flexible Catheter size: 19 Gauge Catheter at skin depth: 12 cm Test dose: negative and 1.5% lidocaine with Epi 1:200 K  Assessment Sensory level: T10 Events: blood not aspirated, injection not painful, no injection resistance, negative IV test and no paresthesia  Additional Notes 1 attempt Pt. Evaluated and documentation done after procedure finished. Patient identified. Risks/Benefits/Options discussed with patient including but not limited to bleeding, infection, nerve damage, paralysis, failed block, incomplete pain control, headache, blood pressure changes, nausea, vomiting, reactions to medication both or allergic, itching and postpartum back pain. Confirmed with bedside nurse the patient's most recent platelet count. Confirmed with patient that they are not currently taking any anticoagulation, have any bleeding history or any family history of bleeding disorders. Patient expressed understanding and wished to proceed. All questions were answered. Sterile technique was used throughout the entire procedure. Please see nursing notes for vital signs. Test dose was given through epidural catheter and negative prior to continuing to dose epidural or  start infusion. Warning signs of high block given to the patient including shortness of breath, tingling/numbness in hands, complete motor block, or any concerning symptoms with instructions to call for help. Patient was given instructions on fall risk and not to get out of bed. All questions and concerns addressed with instructions to call with any issues or inadequate analgesia.   Patient tolerated the insertion well without immediate complications.Reason for block:procedure for pain

## 2018-12-08 NOTE — Lactation Note (Signed)
This note was copied from a baby's chart. Lactation Consultation Note  Patient Name: Girl Terrionna Bridwell [id number] Date: 12/08/2018 Reason for consult: Initial assessment;Term;Other (Comment)(Mom tested (+) MJ 04/15/2018, 09/10/2018 & 12/08/2018)  Mom's choice on admission was to breast and bottle feed.  When [place] went in room to assist mom with breast feeding, FOB was giving bottle with 20 ml Gerber in bottle.  He reports putting bottle in her mouth, but did not take any.  Explained newborn stomach size and discouraged from giving more than 5 to 10 ml for now if chose to give formula.  FOB did not want baby put skin to skin.  Offered to help mom with first breast feeding.  Mom declined.  Mom reports not being able to get nipple ring out yet, but when she does will put baby to breast.  She reports breastfeeding oldest for over 1 year and 2nd for 9 months.  The only challenges she voiced were when baby started cutting teeth.  Briefly discussed mom's marijuana use and discouraged while breast feeding explaining risks.  Reviewed supply and demand and need to stimulate breasts early and often to bring in mature milk and ensure a plentiful milk supply.  Discussed newborn feeding cues, adequate intake and output amounts, normal course of lactation and routine newborn feeding patterns.  May need to reinforce teaching since mom and FOB were on their cell phones.  Encouraged mom to call for help when needed.       Maternal Data Formula Feeding for Exclusion: No Has patient been taught Hand Expression?: (Mom reports knowing how to hand express) Does the patient have breastfeeding experience prior to this delivery?: Yes  Feeding Feeding Type: Bottle Fed - Formula Nipple Type: Slow - flow  LATCH Score                   Interventions Interventions: Breast feeding basics reviewed(Mom declined to pump at present)  Lactation Tools Discussed/Used Tools: Bottle WIC Program: Yes   Consult Status Consult  Status: PRN    Jarold Motto 12/08/2018, 7:51 PM

## 2018-12-08 NOTE — Progress Notes (Addendum)
L&D Progress Note   S: Feeling better after epidural.  O: BP 124/81   Pulse 100   Temp 97.7 F (36.5 C) (Oral)   Resp 17   Ht 5\' 2"  (1.575 m)   Wt 74 kg   LMP 03/09/2018 (Exact Date)   BMI 29.84 kg/m   General: appears comfortable, on peanut ball.  FHR: 120s with accelerations to 140s to 150s, moderate variability Contractions: every 2 to 7 minutes apart with coupling IUPC was inserted earlier  Currently MVUs 120 Cervix: 5cm/70%/-1 to 0   A: TOLAC-slow progress Inadequate MVUs after epidural. FWB: Cat 1  P: Low dose Pitocin augmentation Continue to monitor FHR and contractions continuously.  Dalia Heading, CNM

## 2018-12-08 NOTE — Discharge Summary (Signed)
Physician Obstetric Discharge Summary  Patient ID: Carolyn Dawson MRN: [id number] DOB/AGE: 28-05-1990 28 y.o.   Date of Admission: 12/08/2018  Date of Delivery: 12/08/2018 Delivering Provider: Dalia Heading, CNM Date of Discharge: 12/10/2018  Admitting Diagnosis: Onset of Labor at [redacted]w[redacted]d  Secondary Diagnosis: Previous Cesarean section, Thrombocytopenia  Mode of Delivery: normal spontaneous vaginal delivery 12/08/2018      Discharge Diagnosis: Term intrauterine pregnancy-delivered, Vaginal birth after Cesarean section   Intrapartum Procedures: epidural, pitocin augmentation and placement of intrauterine catheter   Post partum procedures: none  Complications: none   Brief Hospital Course  Carolyn Dawson is a [id number] who had a SVD on 12/08/2018;  for further details of this delivery, please refer to the delivery note.  Patient had an uncomplicated postpartum course.  By time of discharge on PPD#2, her pain was controlled on oral pain medications; she had appropriate lochia and was ambulating, voiding without difficulty and tolerating regular diet.  She was deemed stable for discharge to home.    Labs: CBC Latest Ref Rng & Units 12/09/2018 12/08/2018 11/13/2018  WBC 4.0 - 10.5 K/uL 11.3(H) 9.2 6.6  Hemoglobin 12.0 - 15.0 g/dL 11.5(L) 11.7(L) 10.8(L)  Hematocrit 36.0 - 46.0 % 33.8(L) 35.5(L) 32.9(L)  Platelets 150 - 400 K/uL 117(L) 114(L) 115(L)   A POS  Physical exam:  Blood pressure 113/76, pulse 73, temperature 98 F (36.7 C), temperature source Oral, resp. rate 20, height 5\' 2"  (1.575 m), weight 74 kg, last menstrual period 03/09/2018, SpO2 98 %, currently breastfeeding. General: alert and no distress Lochia: appropriate Abdomen: soft, NT Uterine Fundus: firm Incision: NA Extremities: No evidence of DVT seen on physical exam. No lower extremity edema.  Discharge Instructions: Per After Visit Summary. Activity: Advance as tolerated. Pelvic rest for 6 weeks.  Also refer to  Discharge Instructions Diet: Regular Medications: Allergies as of 12/10/2018   No Known Allergies     Medication List    STOP taking these medications   promethazine 25 MG tablet Commonly known as: PHENERGAN      Outpatient follow up:  Follow-up Information    Dalia Heading, CNM. Schedule an appointment as soon as possible for a visit.   Specialty: Certified Nurse Midwife Why: in 2 weeks for a postpartum check Contact information: [address] [place]  [id number] [phone]          Postpartum contraception: undecided  Discharged Condition: good  Discharged to: home   Newborn Data: Female infant Laddie Aquas  6#10.4 oz Disposition:home with mother  Apgars: APGAR (1 MIN): 9   APGAR (5 MINS): 9   APGAR (10 MINS):    Baby Feeding: Breast  Rod Can, CNM 12/10/2018 11:54 AM

## 2018-12-08 NOTE — Progress Notes (Signed)
VBAC protocol initiated. OR and anesthesia aware.

## 2018-12-08 NOTE — Anesthesia Preprocedure Evaluation (Signed)
Anesthesia Evaluation  Patient identified by MRN, date of birth, ID band Patient awake    Reviewed: Allergy & Precautions, H&P , NPO status , Patient's Chart, lab work & pertinent test results  Airway Mallampati: II  TM Distance: >3 FB     Dental   Pulmonary asthma , former smoker,    Pulmonary exam normal breath sounds clear to auscultation       Cardiovascular Exercise Tolerance: Good negative cardio ROS Normal cardiovascular exam     Neuro/Psych Anxiety negative neurological ROS     GI/Hepatic negative GI ROS, Neg liver ROS,   Endo/Other  negative endocrine ROS  Renal/GU negative Renal ROS  negative genitourinary   Musculoskeletal   Abdominal   Peds  Hematology  (+) Blood dyscrasia, anemia ,   Anesthesia Other Findings   Reproductive/Obstetrics (+) Pregnancy                             Anesthesia Physical Anesthesia Plan  ASA: II  Anesthesia Plan: Epidural   Post-op Pain Management:    Induction:   PONV Risk Score and Plan:   Airway Management Planned:   Additional Equipment:   Intra-op Plan:   Post-operative Plan:   Informed Consent: I have reviewed the patients History and Physical, chart, labs and discussed the procedure including the risks, benefits and alternatives for the proposed anesthesia with the patient or authorized representative who has indicated his/her understanding and acceptance.     Dental Advisory Given  Plan Discussed with: Anesthesiologist  Anesthesia Plan Comments:         Anesthesia Quick Evaluation
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
## 2018-12-08 NOTE — Discharge Instructions (Signed)
°Vaginal Delivery, Care After °Refer to this sheet in the next few weeks. These discharge instructions provide you with information on caring for yourself after delivery. Your caregiver may also give you specific instructions. Your treatment has been planned according to the most current medical practices available, but problems sometimes occur. Call your caregiver if you have any problems or questions after you go home. °HOME CARE INSTRUCTIONS °1. Take over-the-counter or prescription medicines only as directed by your caregiver or pharmacist. °2. Do not drink alcohol, especially if you are breastfeeding or taking medicine to relieve pain. °3. Do not smoke tobacco. °4. Continue to use good perineal care. Good perineal care includes: °1. Wiping your perineum from back to front °2. Keeping your perineum clean. °3. You can do sitz baths twice a day, to help keep this area clean °5. Do not use tampons, douche or have sex for 6 weeks °6. Shower only and avoid sitting in submerged water, aside from sitz baths °7. Wear a well-fitting bra that provides breast support. °8. Eat healthy foods. °9. Drink enough fluids to keep your urine clear or pale yellow. °10. Eat high-fiber foods such as whole grain cereals and breads, brown rice, beans, and fresh fruits and vegetables every day. These foods may help prevent or relieve constipation. °11. Avoid constipation with high fiber foods or medications, such as miralax or metamucil °12. Follow your caregiver's recommendations regarding resumption of activities such as climbing stairs, driving, lifting, exercising, or traveling. °13. Talk to your caregiver about resuming sexual activities. Resumption of sexual activities after 6 weeks is dependent upon your risk of infection, your rate of healing, and your comfort and desire to resume sexual activity. °14. Try to have someone help you with your household activities and your newborn for at least a few days after you leave the  hospital. °15. Rest as much as possible. Try to rest or take a nap when your newborn is sleeping. °16. Increase your activities gradually. °17. Keep all of your scheduled postpartum appointments. It is very important to keep your scheduled follow-up appointments. At these appointments, your caregiver will be checking to make sure that you are healing physically and emotionally. °SEEK MEDICAL CARE IF:  °· You are passing large clots from your vagina. Save any clots to show your caregiver. °· You have a foul smelling discharge from your vagina. °· You have trouble urinating. °· You are urinating frequently. °· You have pain when you urinate. °· You have a change in your bowel movements. °· You have increasing redness, pain, or swelling near your vaginal incision (episiotomy) or vaginal tear. °· You have pus draining from your episiotomy or vaginal tear. °· Your episiotomy or vaginal tear is separating. °· You have painful, hard, or reddened breasts. °· You have a severe headache. °· You have blurred vision or see spots. °· You feel sad or depressed. °· You have thoughts of hurting yourself or your newborn. °· You have questions about your care, the care of your newborn, or medicines. °· You are dizzy or light-headed. °· You have a rash. °· You have nausea or vomiting. °· You were breastfeeding and have not had a menstrual period within 12 weeks after you stopped breastfeeding. °· You are not breastfeeding and have not had a menstrual period by the 12th week after delivery. °· You have a fever of 100.5 or more °SEEK IMMEDIATE MEDICAL CARE IF:  °· You have persistent pain. °· You have chest pain. °· You have shortness   of breath. °· You faint. °· You have leg pain. °· You have stomach pain. °· Your vaginal bleeding saturates two or more sanitary pads in 1 hour. °MAKE SURE YOU:  °· Understand these instructions. °· Will watch your condition. °· Will get help right away if you are not doing well or get worse. °Document  Released: 12/16/1999 Document Revised: 05/04/2013 Document Reviewed: 08/15/2011 °ExitCare® Patient Information ©2015 [employer]. This information is not intended to replace advice given to you by your health care provider. Make sure you discuss any questions you have with your health care provider. ° °Sitz Bath °A sitz bath is a warm water bath taken in the sitting position. The water covers only the hips and butt (buttocks). We recommend using one that fits in the toilet, to help with ease of use and cleanliness. It may be used for either healing or cleaning purposes. Sitz baths are also used to relieve pain, itching, or muscle tightening (spasms). The water may contain medicine. Moist heat will help you heal and relax.  °HOME CARE  °Take 3 to 4 sitz baths a day. °18. Fill the bathtub half-full with warm water. °19. Sit in the water and open the drain a little. °20. Turn on the warm water to keep the tub half-full. Keep the water running constantly. °21. Soak in the water for 15 to 20 minutes. °22. After the sitz bath, pat the affected area dry. °GET HELP RIGHT AWAY IF: °You get worse instead of better. Stop the sitz baths if you get worse. °MAKE SURE YOU: °· Understand these instructions. °· Will watch your condition. °· Will get help right away if you are not doing well or get worse. °Document Released: 01/26/2004 Document Revised: 09/12/2011 Document Reviewed: 04/17/2010 °ExitCare® Patient Information ©2015 [employer]. This information is not intended to replace advice given to you by your health care provider. Make sure you discuss any questions you have with your health care provider. ° ° °

## 2018-12-09 LAB — CBC
HCT: 33.8 % — ABNORMAL LOW (ref 36.0–46.0)
Hemoglobin: 11.5 g/dL — ABNORMAL LOW (ref 12.0–15.0)
MCH: 28.1 pg (ref 26.0–34.0)
MCHC: 34 g/dL (ref 30.0–36.0)
MCV: 82.6 fL (ref 80.0–100.0)
Platelets: 117 10*3/uL — ABNORMAL LOW (ref 150–400)
RBC: 4.09 MIL/uL (ref 3.87–5.11)
RDW: 12.6 % (ref 11.5–15.5)
WBC: 11.3 10*3/uL — ABNORMAL HIGH (ref 4.0–10.5)
nRBC: 0 % (ref 0.0–0.2)

## 2018-12-09 LAB — RPR: RPR Ser Ql: NONREACTIVE
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
## 2018-12-09 NOTE — Anesthesia Postprocedure Evaluation (Signed)
Anesthesia Post Note  Patient: Carolyn Dawson  Procedure(s) Performed: AN AD HOC LABOR EPIDURAL  Patient location during evaluation: Mother Baby Anesthesia Type: Epidural Level of consciousness: awake and alert Pain management: pain level controlled Vital Signs Assessment: post-procedure vital signs reviewed and stable Respiratory status: spontaneous breathing, nonlabored ventilation and respiratory function stable Cardiovascular status: stable Postop Assessment: no headache, no backache and epidural receding Anesthetic complications: no     Last Vitals:  Vitals:   12/09/18 0011 12/09/18 0443  BP: 124/79 111/71  Pulse: 70 81  Resp: 16 16  Temp: 36.9 C 36.8 C  SpO2: 100% 97%    Last Pain:  Vitals:   12/09/18 0443  TempSrc: Oral  PainSc:                  [name]
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
## 2018-12-09 NOTE — Progress Notes (Signed)
Subjective:  Patient is doing well physically postpartum day 1. She has some leg pain. She is tolerating regular diet. Her pain is controlled with PO medications. She is ambulating and voiding without difficulty. She reports breastfeeding is going well. Patient is expressing anger and frustration with FOB. She hopes to be discharged as soon as possible after newborn screens. Her mother is her support person.  Objective:  Vital signs in last 24 hours: Temp:  [97.7 F (36.5 C)-98.7 F (37.1 C)] 98.3 F (36.8 C) (12/08 0811) Pulse Rate:  [70-102] 82 (12/08 1029) Resp:  [16-18] 18 (12/08 0811) BP: (102-130)/(44-91) 128/81 (12/08 0811) SpO2:  [95 %-100 %] 98 % (12/08 0811)    General: NAD Pulmonary: no increased work of breathing Abdomen: non-distended, non-tender, fundus firm at level of umbilicus Extremities: no edema, no erythema, no tenderness  Results for orders placed or performed during the hospital encounter of 12/08/18 (from the past 72 hour(s))  SARS Coronavirus 2 by RT PCR (hospital order, [facility]) Nasopharyngeal Nasopharyngeal Swab     Status: None   Collection Time: 12/08/18  9:44 AM   Specimen: Nasopharyngeal Swab  Result Value Ref Range   SARS Coronavirus 2 NEGATIVE NEGATIVE    Comment: (NOTE) SARS-CoV-2 target nucleic acids are NOT DETECTED. The SARS-CoV-2 RNA is generally detectable in upper and lower respiratory specimens during the acute phase of infection. The lowest concentration of SARS-CoV-2 viral copies this assay can detect is 250 copies / mL. A negative result does not preclude SARS-CoV-2 infection and should not be used as the sole basis for treatment or other patient management decisions.  A negative result may occur with improper specimen collection / handling, submission of specimen other than nasopharyngeal swab, presence of viral mutation(s) within the areas targeted by this assay, and inadequate number of viral copies  (<250 copies / mL). A negative result must be combined with clinical observations, patient history, and epidemiological information. Fact Sheet for Patients:   BoilerBrush.com.cyhttps://www.fda.gov/media/136312/download Fact Sheet for Healthcare Providers: https://pope.com/https://www.fda.gov/media/136313/download This test is not yet approved or cleared  by the [place] FDA and has been authorized for detection and/or diagnosis of SARS-CoV-2 by FDA under an Emergency Use Authorization (EUA).  This EUA will remain in effect (meaning this test can be used) for the duration of the COVID-19 declaration under Section 564(b)(1) of the Act, 21 U.S.C. section 360bbb-3(b)(1), unless the authorization is terminated or revoked sooner. Performed at [facility], [address] [place], [place] [address]   Type and screen [facility]     Status: None   Collection Time: 12/08/18 10:30 AM  Result Value Ref Range   ABO/RH(D) A POS    Antibody Screen NEG    Sample Expiration      12/11/2018,2359 Performed at [facility], [address] [place], [place] [address]   CBC     Status: Abnormal   Collection Time: 12/08/18 10:30 AM  Result Value Ref Range   WBC 9.2 4.0 - 10.5 K/uL   RBC 4.27 3.87 - 5.11 MIL/uL   Hemoglobin 11.7 (L) 12.0 - 15.0 g/dL   HCT 09.835.5 (L) 11.936.0 - 14.746.0 %   MCV 83.1 80.0 - 100.0 fL   MCH 27.4 26.0 - 34.0 pg   MCHC 33.0 30.0 - 36.0 g/dL   RDW 82.912.8 56.211.5 - 13.015.5 %   Platelets 114 (L) 150 - 400 K/uL    Comment: Immature Platelet Fraction may be clinically indicated,  consider ordering this additional test [id number]    nRBC 0.0 0.0 - 0.2 %    Comment: Performed at [facility], [address] [place], [place] [address]  Urine Drug Screen, Qualitative [facility] only)     Status: Abnormal   Collection Time: 12/08/18 10:30 AM  Result Value Ref Range   Tricyclic, Ur Screen NONE DETECTED NONE DETECTED   Amphetamines, Ur Screen NONE DETECTED NONE DETECTED    MDMA (Ecstasy)Ur Screen NONE DETECTED NONE DETECTED   Cocaine Metabolite,Ur [place] NONE DETECTED NONE DETECTED   Opiate, Ur Screen NONE DETECTED NONE DETECTED   Phencyclidine (PCP) Ur S NONE DETECTED NONE DETECTED   Cannabinoid 50 Ng, Ur [place] POSITIVE (A) NONE DETECTED   Barbiturates, Ur Screen NONE DETECTED NONE DETECTED   Benzodiazepine, Ur Scrn NONE DETECTED NONE DETECTED   Methadone Scn, Ur NONE DETECTED NONE DETECTED    Comment: (NOTE) Tricyclics + metabolites, urine    Cutoff 1000 ng/mL Amphetamines + metabolites, urine  Cutoff 1000 ng/mL MDMA (Ecstasy), urine              Cutoff 500 ng/mL Cocaine Metabolite, urine          Cutoff 300 ng/mL Opiate + metabolites, urine        Cutoff 300 ng/mL Phencyclidine (PCP), urine         Cutoff 25 ng/mL Cannabinoid, urine                 Cutoff 50 ng/mL Barbiturates + metabolites, urine  Cutoff 200 ng/mL Benzodiazepine, urine              Cutoff 200 ng/mL Methadone, urine                   Cutoff 300 ng/mL The urine drug screen provides only a preliminary, unconfirmed analytical test result and should not be used for non-medical purposes. Clinical consideration and professional judgment should be applied to any positive drug screen result due to possible interfering substances. A more specific alternate chemical method must be used in order to obtain a confirmed analytical result. Gas chromatography / mass spectrometry (GC/MS) is the preferred confirmat ory method. Performed at [facility], [address] [place], [place] [address]   RPR     Status: None   Collection Time: 12/08/18 10:30 AM  Result Value Ref Range   RPR Ser Ql NON REACTIVE NON REACTIVE    Comment: Performed at [place] [address] [place], [place] [address]  CBC     Status: Abnormal   Collection Time: 12/09/18  4:44 AM  Result Value Ref Range   WBC 11.3 (H) 4.0 - 10.5 K/uL   RBC 4.09 3.87 - 5.11 MIL/uL   Hemoglobin 11.5 (L) 12.0 - 15.0 g/dL    HCT 33.8 (L) 36.0 - 46.0 %   MCV 82.6 80.0 - 100.0 fL   MCH 28.1 26.0 - 34.0 pg   MCHC 34.0 30.0 - 36.0 g/dL   RDW 12.6 11.5 - 15.5 %   Platelets 117 (L) 150 - 400 K/uL    Comment: Immature Platelet Fraction may be clinically indicated, consider ordering this additional test [id number]    nRBC 0.0 0.0 - 0.2 %    Comment: Performed at [facility], [address] [place], [place] [address]    Assessment:   28 y.o. [id number] postpartum day # 1  Plan:    1) Acute blood loss anemia - hemodynamically stable and asymptomatic -  po ferrous sulfate  2) Blood Type --/--/A POS (12/07 1030) / Rubella 2.42 (04/14 0940) / Varicella Immune  3) TDAP status up to date  4) Feeding plan breast  5)  Education given regarding options for contraception, as well as compatibility with breast feeding if applicable.  Patient is undecided regarding birth control.  6) Disposition: continue current care with possible discharge to home later today   Tresea Mall, CNM [facility] 12/09/2018, 12:00 PM

## 2018-12-09 NOTE — Plan of Care (Signed)
In Preparation for discharge in the morning 12/10/2018

## 2018-12-09 NOTE — Progress Notes (Signed)
CSW made aware that carseat for infant has been delivered to [facility] at bedside. CSW asked MOB if there were any further needs and MOB reported "not that I can think of". CSW advised MOB that if new need arises to please advise RN so that CSW can be notified. MOB reported understanding.      Virgie Dad Reizel Calzada, MSW, LCSW [facility] [place] [facility] [phone]  C

## 2018-12-10 ENCOUNTER — Encounter: Payer: Medicaid Other | Admitting: Obstetrics and Gynecology

## 2018-12-10 NOTE — Progress Notes (Signed)
DC instr reviewed with pt.  Verb u/o of care for self and f/u appt.  Reviewed all the abnormal s/s that could occur and to notify provider as needed

## 2018-12-10 NOTE — Progress Notes (Signed)
DC to home to car in [facility] escorted out by staff

## 2019-02-06 ENCOUNTER — Encounter: Payer: Self-pay | Admitting: Physician Assistant

## 2019-02-06 ENCOUNTER — Ambulatory Visit: Payer: Medicaid Other | Admitting: Physician Assistant

## 2019-02-06 ENCOUNTER — Other Ambulatory Visit: Payer: Self-pay

## 2019-02-06 VITALS — BP 111/63 | Ht 62.0 in | Wt 140.0 lb

## 2019-02-06 DIAGNOSIS — Z1331 Encounter for screening for depression: Secondary | ICD-10-CM

## 2019-02-06 DIAGNOSIS — Z309 Encounter for contraceptive management, unspecified: Secondary | ICD-10-CM

## 2019-02-06 DIAGNOSIS — A599 Trichomoniasis, unspecified: Secondary | ICD-10-CM

## 2019-02-06 LAB — WET PREP FOR TRICH, YEAST, CLUE
Trichomonas Exam: POSITIVE — AB
Yeast Exam: NEGATIVE

## 2019-02-06 LAB — HEMOGLOBIN, FINGERSTICK: Hemoglobin: 11.7 g/dL (ref 11.1–15.9)

## 2019-02-06 MED ORDER — METRONIDAZOLE 500 MG PO TABS: 2000.0000 mg | ORAL_TABLET | Freq: Once | ORAL | 0 refills | Status: AC

## 2019-02-06 MED ORDER — THERA VITAL M PO TABS: 1.0000 | ORAL_TABLET | Freq: Every day | ORAL | 0 refills | Status: DC

## 2019-02-06 NOTE — Progress Notes (Signed)
Wet mount reviewed, patient treated for trich per SO. Patient given partner card and counseled to be retested in 3 months.Burt Knack, RN

## 2019-02-06 NOTE — Progress Notes (Signed)
Patient here for PP exam. Has PNC at [facility], but hasn't been able to schedule appointment there. Here with baby born 12/08/2018, SVD. Marland KitchenBurt Knack, RN

## 2019-02-06 NOTE — Progress Notes (Signed)
Post Partum Exam  Carolyn Dawson is a 29 y.o. [id number] female who presents for a postpartum visit. She is 8 weeks postpartum following a vaginal birth after cesarean section. I have fully reviewed the prenatal and intrapartum course. The delivery was at [redacted]w[redacted]d gestational weeks.  Anesthesia: epidural. Postpartum course has been uneventful except for FOB trying to get custody of infant. Baby's course has been uneventful. Baby is feeding by Breast Bleeding no bleeding. Bowel function is normal. Bladder function is normal. Patient is not sexually active. Last sex several days prior to delivery. Contraception method is abstinence. Has had some clear vaginal discharge without odor or itch. Also feels that her temperature sensitivity is different from that of her children ("when they are cold, I am hot, when they are hot, I am cold.") States h/o bipolar d/o treated at [facility], but has not had care since they closed. Feels very able to care for kids/self. No HI/SI.  Postpartum depression screening: Edinburgh Postnatal Depression Scale - 02/06/19 1427      Edinburgh Postnatal Depression Scale:  In the Past 7 Days   I have been able to laugh and see the funny side of things.  1    I have looked forward with enjoyment to things.  1    I have blamed myself unnecessarily when things went wrong.  1    I have been anxious or worried for no good reason.  2    I have felt scared or panicky for no good reason.  2    Things have been getting on top of me.  1    I have been so unhappy that I have had difficulty sleeping.  3    I have felt sad or miserable.  0    I have been so unhappy that I have been crying.  1    The thought of harming myself has occurred to me.  0    Edinburgh Postnatal Depression Scale Total  12       Last pap smear 04/15/18 done and was Normal  Review of Systems Pertinent items noted in HPI and remainder of comprehensive ROS otherwise negative.    Objective:  BP 111/63   Ht 5\' 2"  (1.575 m)    Wt 140 lb (63.5 kg)   LMP  (LMP Unknown)   Breastfeeding Yes   BMI 25.61 kg/m   Gen: well appearing, NAD Skin: warm & dry, multiple tattoos. HEENT: no scleral icterus CV: RR Lung: Normal WOB Breast:performed-yes  Ext: warm well perfused GU: no inguinal LAD/tenderness. Perineum intact. Sm amt frothy thin discharge in vag vault, pH <4.5, no odor. Vaginal without lesion or tenderness. Cervix parous, non-tender. Uterus sl enlarged, non-tender. No adnexal tenderness or mass. Rectal: performed -  yes No external hemorrhoid.       Assessment:    Normal postpartum exam, with the exception of positive depression screen. Pap smear not done at today's visit.   Plan:   1. Contraception: Contraception counseling: Reviewed all forms of birth control options in the tiered based approach. available including abstinence; over the counter/barrier methods; hormonal contraceptive medication including pill, patch, ring, injection,contraceptive implant; hormonal and nonhormonal IUDs; permanent sterilization options including vasectomy and the various tubal sterilization modalities. Risks, benefits, and typical effectiveness rates were reviewed.  Questions were answered.  Written information was also given to the patient to review.  Patient desires ongoing abstinence. She will follow up in  12 mo for surveillance.  She was told to  call with any further questions, or with any concerns about this method of contraception.  Emphasized use of condoms 100% of the time for STI prevention.  Patient was not offered ECP.  Recommended delay in pregnancy for 18 months   2. Infant feeding:  patient is currently feeding with breast.  If breastmilk feeding patient was given letter for employer to provide appropriate pumping time to express breastmilk.   -Recommended patient engage with WIC/BFpeer counselors  -Counseled to sign new child up for [facility] services 3. Mood: EPDS is increased risk (score = 12). Reviewed resources and  that mood sx in first year after pregnancy are considered related to pregnancy and to reach out for help at [facility] if needed. Discussed [facility] as link to care and availability of LCSW for counseling. Pt declines LCSW or other mental health referral.  4. Chronic Medical Conditions:  Biploar disorder - enc pt to f/u with PCP.  1. Postpartum exam Trich noted on wet prep - treat per S.O. - Hemoglobin, venipuncture - HIV [facility] - Syphilis Serology, [facility]  2. Encounter for contraceptive management, unspecified type Pt declines statistically reliable BCM aside from breastfeeding/abstinence. Rec MVI with folic acid qd.  3. Positive depression screening Pt declines LCSW eval.   Patient given handout about PCP care in the community Given MVI per family planning program guidelines and availability  Follow up in: 1 year or as needed.

## 2019-06-17 NOTE — Telephone Encounter (Signed)
error 

## 2019-12-24 IMAGING — US OBSTETRIC <14 WK ULTRASOUND
1 series · 14 of 28 positions shown · non-contrast
Comparison: None.

CLINICAL DATA: Pregnant patient pelvic pain, nausea and vomiting.
Recent vaginal bleeding.

EXAM:
OBSTETRIC <14 WK ULTRASOUND
TECHNIQUE: Transabdominal ultrasound was performed for evaluation of the
gestation as well as the maternal uterus and adnexal regions.

[Series 1: obstetric <14 wk ultrasound · 0.19mm/px · 14 of 77 slices shown]
[im 3/77]
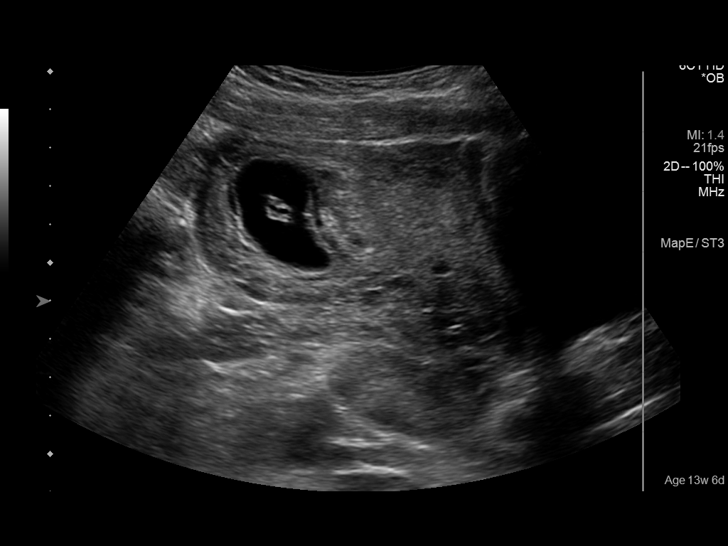
[im 9/77]
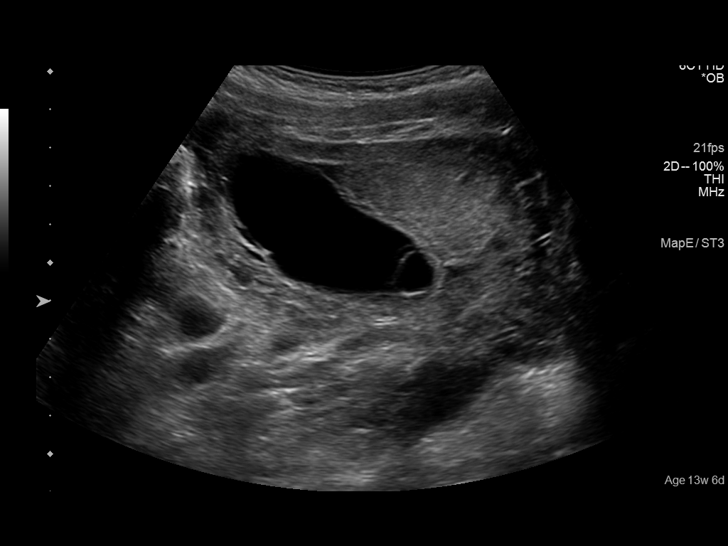
[im 15/77]
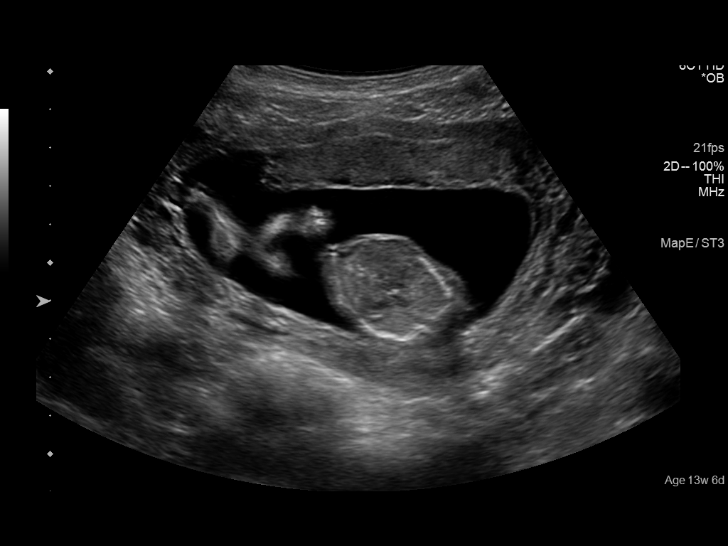
[im 20/77]
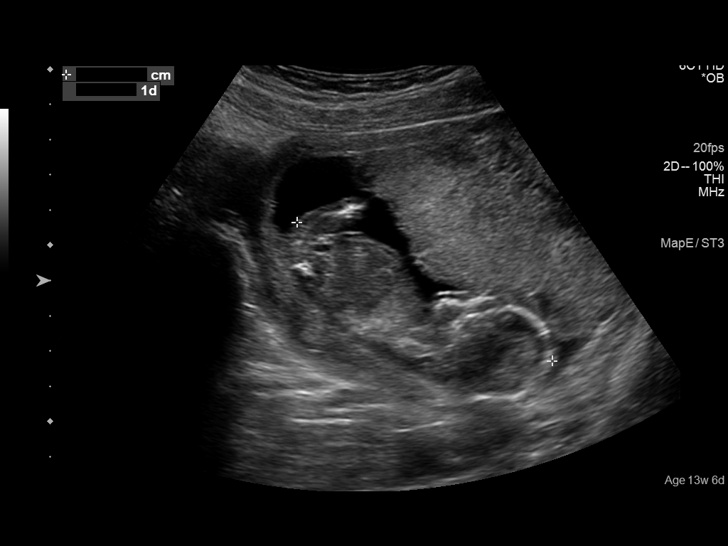
[im 26/77]
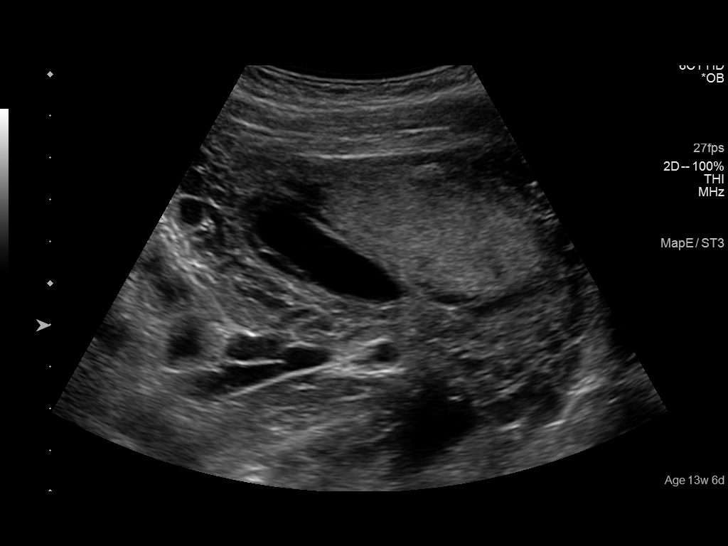
[im 31/77]
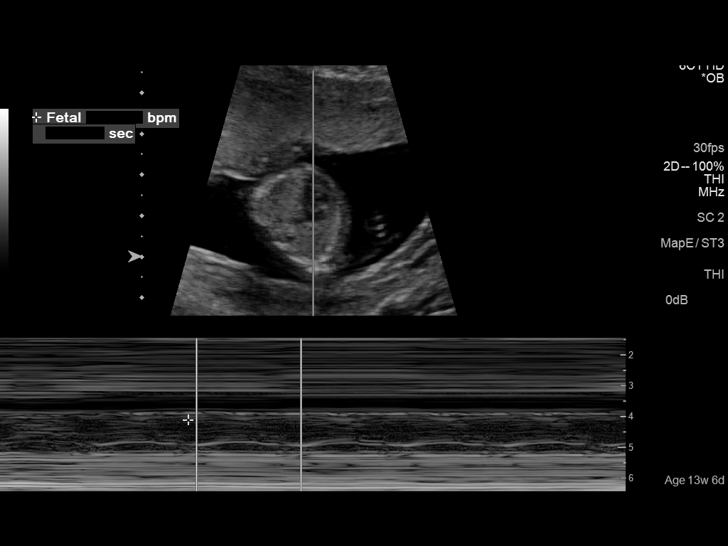
[im 37/77]
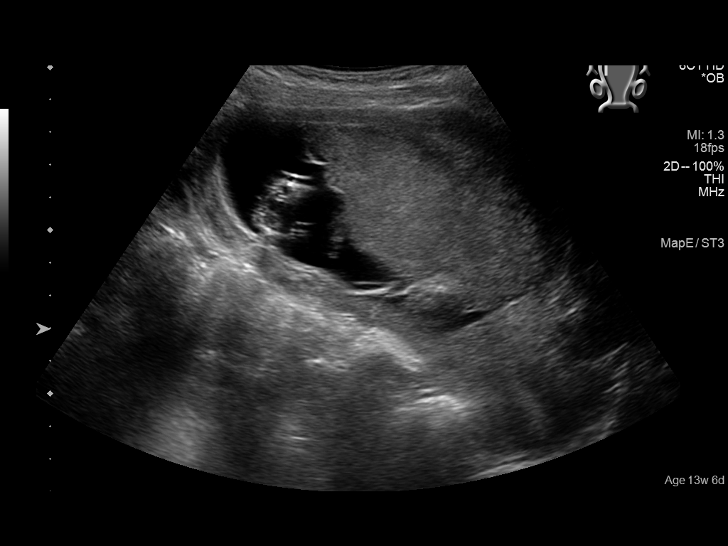
[im 43/77]
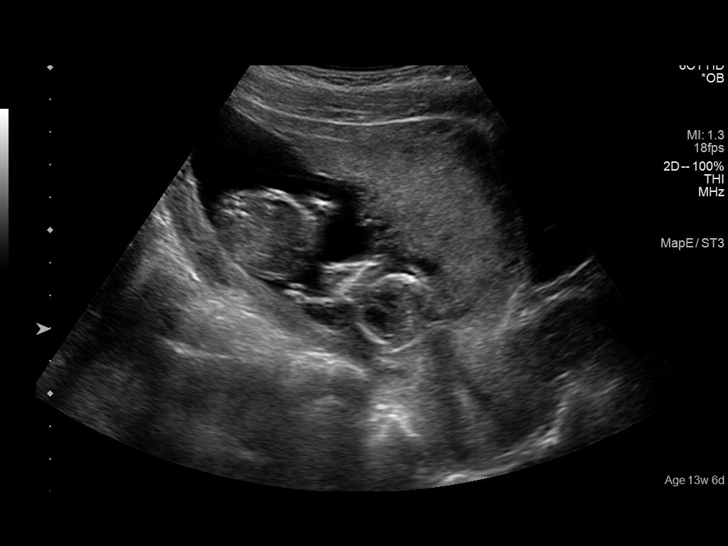
[im 48/77]
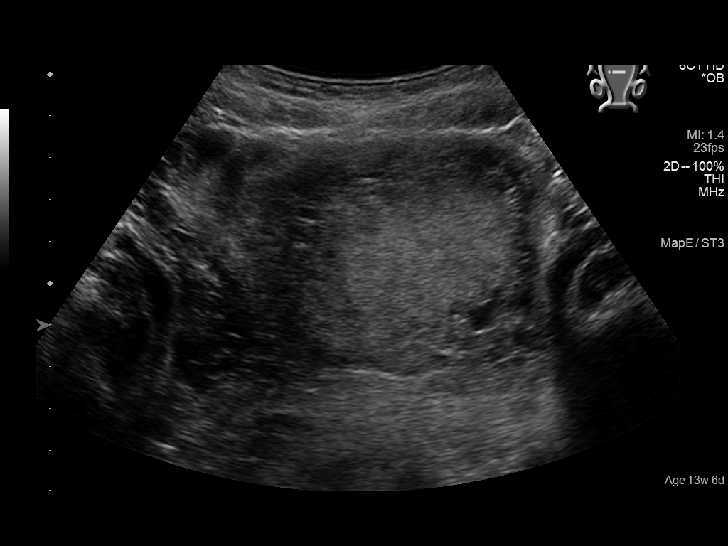
[im 54/77]
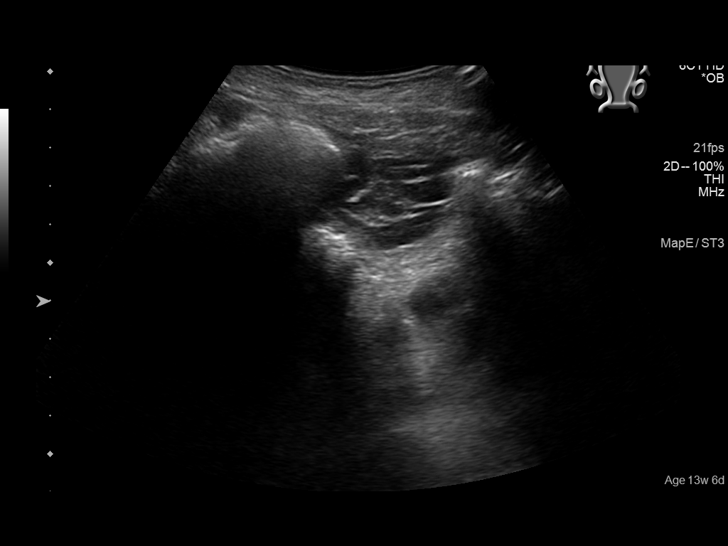
[im 60/77]
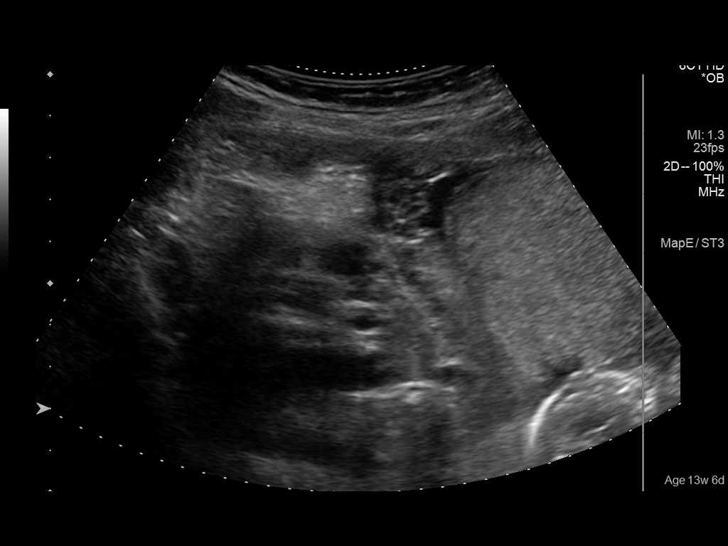
[im 65/77]
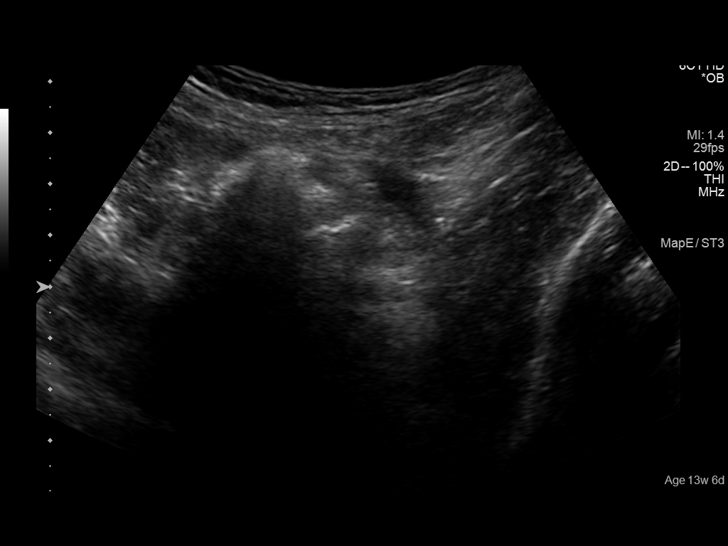
[im 71/77]
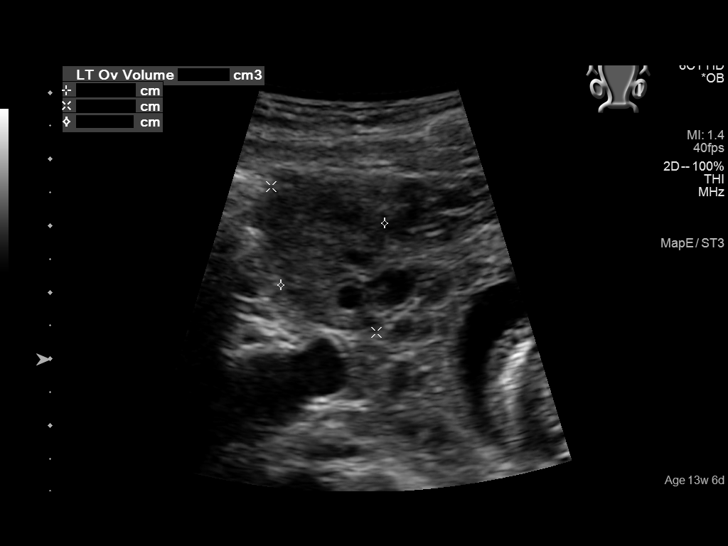
[im 77/77]
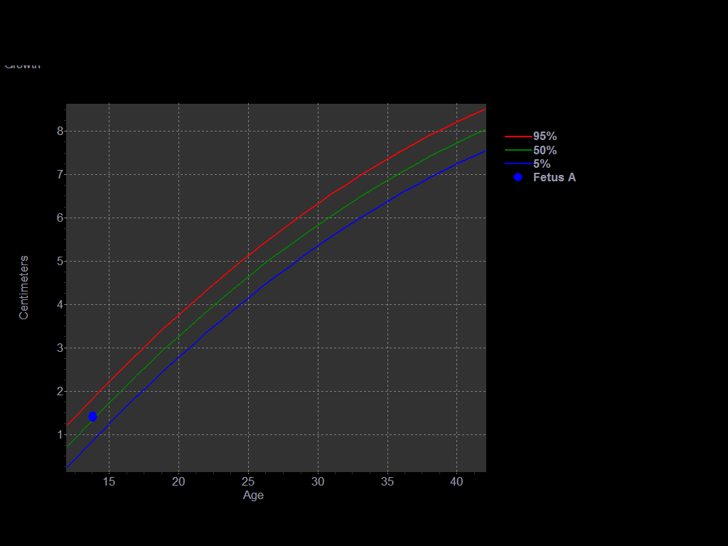

[14 of 28 positions shown; findings below may reference images not displayed]

FINDINGS: Intrauterine gestational sac: Single visualized.

Yolk sac:  Not visualized.

Embryo:  Visualized.

Cardiac Activity: Detected.

Heart Rate: 147 bpm

CRL:   8.25 cm   14 w 1 d                  US EDC: 12/11/2018

Subchorionic hemorrhage:  None visualized.

Maternal uterus/adnexae: The left ovary appears normal. The right
ovary is not visualized.
IMPRESSION: Single living intrauterine pregnancy.  No acute finding.

## 2020-01-03 ENCOUNTER — Other Ambulatory Visit: Payer: Self-pay

## 2020-01-03 ENCOUNTER — Ambulatory Visit
Admission: EM | Admit: 2020-01-03 | Discharge: 2020-01-03 | Disposition: A | Discharge status: Home / Self Care | Payer: Medicaid Other | Attending: Family Medicine | Admitting: Family Medicine

## 2020-01-03 DIAGNOSIS — U071 COVID-19: Secondary | ICD-10-CM | POA: Diagnosis not present

## 2020-01-03 DIAGNOSIS — R519 Headache, unspecified: Secondary | ICD-10-CM

## 2020-01-03 DIAGNOSIS — R52 Pain, unspecified: Secondary | ICD-10-CM | POA: Diagnosis not present

## 2020-01-03 MED ORDER — KETOROLAC TROMETHAMINE 10 MG PO TABS: 10.0000 mg | ORAL_TABLET | Freq: Four times a day (QID) | ORAL | 0 refills | Status: DC | PRN
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
## 2020-01-03 NOTE — ED Triage Notes (Signed)
Patient presents to Urgent Care with complaints of pelvic pain and headache since yesterday. Pt reports pain increased pain with movement described as constant cramping pain.

## 2020-01-03 NOTE — Discharge Instructions (Signed)
Rest.   Fluids.  Medication as prescribed.  COVID test results should be back tomorrow.  Take care  Dr. Adriana Simas

## 2020-01-04 ENCOUNTER — Encounter: Payer: Self-pay | Admitting: Physician Assistant

## 2020-01-04 LAB — SARS CORONAVIRUS 2 (TAT 6-24 HRS): SARS Coronavirus 2: POSITIVE — AB

## 2020-08-16 ENCOUNTER — Encounter: Payer: Self-pay | Admitting: Emergency Medicine

## 2020-08-16 ENCOUNTER — Emergency Department
Admission: EM | Admit: 2020-08-16 | Discharge: 2020-08-16 | Disposition: A | Discharge status: Home / Self Care | Payer: No Typology Code available for payment source | Attending: Emergency Medicine | Admitting: Emergency Medicine

## 2020-08-16 ENCOUNTER — Other Ambulatory Visit: Payer: Self-pay

## 2020-08-16 DIAGNOSIS — J45909 Unspecified asthma, uncomplicated: Secondary | ICD-10-CM | POA: Diagnosis not present

## 2020-08-16 DIAGNOSIS — Z87891 Personal history of nicotine dependence: Secondary | ICD-10-CM | POA: Insufficient documentation

## 2020-08-16 DIAGNOSIS — S161XXA Strain of muscle, fascia and tendon at neck level, initial encounter: Secondary | ICD-10-CM | POA: Insufficient documentation

## 2020-08-16 DIAGNOSIS — M546 Pain in thoracic spine: Secondary | ICD-10-CM | POA: Diagnosis not present

## 2020-08-16 DIAGNOSIS — Y92410 Unspecified street and highway as the place of occurrence of the external cause: Secondary | ICD-10-CM | POA: Insufficient documentation

## 2020-08-16 DIAGNOSIS — S199XXA Unspecified injury of neck, initial encounter: Secondary | ICD-10-CM | POA: Diagnosis present

## 2020-08-16 MED ORDER — MELOXICAM 15 MG PO TABS: 15.0000 mg | ORAL_TABLET | Freq: Every day | ORAL | 2 refills | Status: DC

## 2020-08-16 MED ORDER — BACLOFEN 10 MG PO TABS: 10.0000 mg | ORAL_TABLET | Freq: Three times a day (TID) | ORAL | 0 refills | Status: AC
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
## 2020-11-12 ENCOUNTER — Ambulatory Visit
Admission: EM | Admit: 2020-11-12 | Discharge: 2020-11-12 | Disposition: A | Discharge status: Home / Self Care | Payer: Medicaid Other | Attending: Physician Assistant | Admitting: Physician Assistant

## 2020-11-12 ENCOUNTER — Other Ambulatory Visit: Payer: Self-pay

## 2020-11-12 DIAGNOSIS — R509 Fever, unspecified: Secondary | ICD-10-CM | POA: Insufficient documentation

## 2020-11-12 DIAGNOSIS — Z20822 Contact with and (suspected) exposure to covid-19: Secondary | ICD-10-CM | POA: Diagnosis not present

## 2020-11-12 DIAGNOSIS — R051 Acute cough: Secondary | ICD-10-CM | POA: Insufficient documentation

## 2020-11-12 DIAGNOSIS — J101 Influenza due to other identified influenza virus with other respiratory manifestations: Secondary | ICD-10-CM | POA: Insufficient documentation

## 2020-11-12 LAB — RAPID INFLUENZA A&B ANTIGENS
Influenza A (ARMC): POSITIVE — AB
Influenza B (ARMC): NEGATIVE

## 2020-11-12 MED ORDER — OSELTAMIVIR PHOSPHATE 75 MG PO CAPS: 75.0000 mg | ORAL_CAPSULE | Freq: Two times a day (BID) | ORAL | 0 refills | Status: AC

## 2020-11-12 MED ORDER — PROMETHAZINE-DM 6.25-15 MG/5ML PO SYRP: 5.0000 mL | ORAL_SOLUTION | Freq: Four times a day (QID) | ORAL | 0 refills | Status: DC | PRN

## 2020-11-12 MED ORDER — IPRATROPIUM BROMIDE 0.06 % NA SOLN: 2.0000 | Freq: Four times a day (QID) | NASAL | 0 refills | Status: DC

## 2020-11-12 MED ORDER — IBUPROFEN 600 MG PO TABS: 600.0000 mg | ORAL_TABLET | Freq: Once | ORAL | Status: DC

## 2020-11-12 NOTE — Discharge Instructions (Addendum)
-  You have the flu.  I sent Tamiflu to the pharmacy.  I also sent cough medication and a nasal spray. - Continue to take Motrin and/or Tylenol for fever control. - Increase rest and fluids. - Can go back to work when fever for greater than 24 hours.

## 2020-11-12 NOTE — ED Triage Notes (Signed)
Body aches, congestion, ear pain x 2 days. Last week diarrhea.
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
## 2020-11-14 LAB — SARS CORONAVIRUS 2 (TAT 6-24 HRS): SARS Coronavirus 2: NEGATIVE

## 2020-11-15 ENCOUNTER — Telehealth: Payer: Self-pay | Admitting: Emergency Medicine

## 2020-11-15 NOTE — Telephone Encounter (Signed)
Patient is requesting a return to work letter after being treated for influenza.

## 2020-11-16 ENCOUNTER — Other Ambulatory Visit: Payer: Self-pay

## 2020-11-16 ENCOUNTER — Ambulatory Visit
Admission: EM | Admit: 2020-11-16 | Discharge: 2020-11-16 | Disposition: A | Discharge status: Home / Self Care | Payer: Medicaid Other | Attending: Internal Medicine | Admitting: Internal Medicine

## 2020-11-16 DIAGNOSIS — H109 Unspecified conjunctivitis: Secondary | ICD-10-CM

## 2020-11-16 DIAGNOSIS — J0100 Acute maxillary sinusitis, unspecified: Secondary | ICD-10-CM | POA: Diagnosis not present

## 2020-11-16 DIAGNOSIS — N644 Mastodynia: Secondary | ICD-10-CM

## 2020-11-16 MED ORDER — AMOXICILLIN-POT CLAVULANATE 875-125 MG PO TABS: 1.0000 | ORAL_TABLET | Freq: Two times a day (BID) | ORAL | 0 refills | Status: DC

## 2020-11-16 MED ORDER — PSEUDOEPHEDRINE HCL ER 120 MG PO TB12: 120.0000 mg | ORAL_TABLET | Freq: Two times a day (BID) | ORAL | 0 refills | Status: DC

## 2020-11-16 MED ORDER — SULFACETAMIDE SODIUM 10 % OP SOLN: 2.0000 [drp] | Freq: Three times a day (TID) | OPHTHALMIC | 0 refills | Status: DC
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
## 2020-11-16 NOTE — ED Triage Notes (Signed)
Patient presents to Urgent Care with complaints of left sided facial pressure and left eye swelling x 2 days ago. Tested positive for the flu 11/12.   Denies fever.

## 2021-06-15 ENCOUNTER — Emergency Department: Payer: Medicaid Other

## 2021-06-15 ENCOUNTER — Emergency Department
Admission: EM | Admit: 2021-06-15 | Discharge: 2021-06-15 | Disposition: A | Discharge status: Home / Self Care | Payer: Medicaid Other | Attending: Emergency Medicine | Admitting: Emergency Medicine

## 2021-06-15 ENCOUNTER — Other Ambulatory Visit: Payer: Self-pay

## 2021-06-15 DIAGNOSIS — M79645 Pain in left finger(s): Secondary | ICD-10-CM | POA: Diagnosis present

## 2021-06-15 DIAGNOSIS — W228XXA Striking against or struck by other objects, initial encounter: Secondary | ICD-10-CM | POA: Insufficient documentation

## 2021-06-15 NOTE — Discharge Instructions (Signed)
Take naproxen twice daily for pain and inflammation. Please make a follow-up appointment with orthopedics, Dr. Joice Lofts.

## 2021-06-15 NOTE — ED Triage Notes (Signed)
Patient to ER with complaints of left pinky finger pain after slamming it in an iron metal door 1 week ago. Bruising and swelling present to finger.

## 2021-06-15 NOTE — ED Provider Notes (Signed)
[facility] Provider Note  Patient Contact: 8:06 PM (approximate)   History   Finger Injury   HPI  Carolyn Dawson is a 31 y.o. female presents to the emergency department with left fifth digit pain after patient closed her hand in an iron door.  She has had difficulty performing flexion at the DIP joint since injury occurred.  No fingernail involvement.  Patient denies fever and chills.      Physical Exam   Triage Vital Signs: ED Triage Vitals  Enc Vitals Group     BP 06/15/21 1850 116/77     Pulse Rate 06/15/21 1848 92     Resp 06/15/21 1848 18     Temp 06/15/21 1848 98.3 F (36.8 C)     Temp Source 06/15/21 1848 Oral     SpO2 06/15/21 1848 98 %     Weight 06/15/21 1854 125 lb (56.7 kg)     Height 06/15/21 1854 5\' 6"  (1.676 m)     Head Circumference --      Peak Flow --      Pain Score 06/15/21 1848 5     Pain Loc --      Pain Edu? --      Excl. in [place]? --     Most recent vital signs: Vitals:   06/15/21 1848 06/15/21 1850  BP:  116/77  Pulse: 92   Resp: 18   Temp: 98.3 F (36.8 C)   SpO2: 98%      General: Alert and in no acute distress. Eyes:  PERRL. EOMI. Head: No acute traumatic findings ENT:      Nose: No congestion/rhinnorhea.      Mouth/Throat: Mucous membranes are moist.  Neck: No stridor. No cervical spine tenderness to palpation. Cardiovascular:  Good peripheral perfusion Respiratory: Normal respiratory effort without tachypnea or retractions. Lungs CTAB. Good air entry to the bases with no decreased or absent breath sounds. Gastrointestinal: Bowel sounds 4 quadrants. Soft and nontender to palpation. No guarding or rigidity. No palpable masses. No distention. No CVA tenderness. Musculoskeletal: Patient has difficulty performing flexion at the DIP joint of the left fifth digit.  Capillary refill less than 2 seconds on the left.  Palpable radial and ulnar pulses. Neurologic:  No gross focal neurologic deficits are  appreciated.  Skin:   No rash noted Other:   ED Results / Procedures / Treatments   Labs (all labs ordered are listed, but only abnormal results are displayed) Labs Reviewed - No data to display      RADIOLOGY  I personally viewed and evaluated these images as part of my medical decision making, as well as reviewing the written report by the radiologist.  ED Provider Interpretation: I personally interpreted x-ray and there is no acute abnormality of the left hand.   PROCEDURES:  Critical Care performed: No  Procedures   MEDICATIONS ORDERED IN ED: Medications - No data to display   IMPRESSION / MDM / ASSESSMENT AND PLAN / ED COURSE  I reviewed the triage vital signs and the nursing notes.                              Assessment and plan Left hand pain 31 year old female presents to the emergency department with acute left fifth digit pain after patient slammed her hand in a door.  There was no acute bony abnormality but patient did have difficulty performing flexion at the  DIP joint of the left fifth digit.  Patient's left fifth digit was placed in extension and patient was advised to follow-up with orthopedics, Dr. Joice Lofts.      FINAL CLINICAL IMPRESSION(S) / ED DIAGNOSES   Final diagnoses:  Pain of finger of left hand     Rx / DC Orders   ED Discharge Orders     None        Note:  This document was prepared using Dragon voice recognition software and may include unintentional dictation errors.   [name], PA-C 06/15/21 2012    Georga Hacking, MD 06/16/21 [phone]

## 2021-07-31 ENCOUNTER — Emergency Department
Admission: EM | Admit: 2021-07-31 | Discharge: 2021-07-31 | Disposition: A | Discharge status: Home / Self Care | Payer: Medicaid Other | Attending: Emergency Medicine | Admitting: Emergency Medicine

## 2021-07-31 ENCOUNTER — Emergency Department: Payer: Medicaid Other

## 2021-07-31 ENCOUNTER — Other Ambulatory Visit: Payer: Self-pay

## 2021-07-31 DIAGNOSIS — S0011XA Contusion of right eyelid and periocular area, initial encounter: Secondary | ICD-10-CM | POA: Diagnosis not present

## 2021-07-31 DIAGNOSIS — S022XXA Fracture of nasal bones, initial encounter for closed fracture: Secondary | ICD-10-CM | POA: Insufficient documentation

## 2021-07-31 DIAGNOSIS — S0990XA Unspecified injury of head, initial encounter: Secondary | ICD-10-CM | POA: Diagnosis not present

## 2021-07-31 DIAGNOSIS — S0992XA Unspecified injury of nose, initial encounter: Secondary | ICD-10-CM | POA: Diagnosis present

## 2021-07-31 NOTE — ED Triage Notes (Signed)
Pt denies any double or blurry vision states she feels pressure around and behind her eye. Pt feels pressure in right side of nose as well.
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
## 2021-07-31 NOTE — ED Notes (Signed)
[place] [employer] notified.

## 2021-08-11 ENCOUNTER — Encounter
Admission: RE | Admit: 2021-08-11 | Discharge: 2021-08-11 | Disposition: A | Discharge status: Home / Self Care | Payer: Medicaid Other | Source: Ambulatory Visit | Attending: Otolaryngology | Admitting: Otolaryngology

## 2021-08-11 VITALS — Ht 60.0 in | Wt 120.0 lb

## 2021-08-11 DIAGNOSIS — Z01818 Encounter for other preprocedural examination: Secondary | ICD-10-CM

## 2021-08-11 NOTE — Patient Instructions (Signed)
Your procedure is scheduled on: 08/15/21 Report to DAY SURGERY DEPARTMENT LOCATED ON 2ND FLOOR MEDICAL MALL ENTRANCE. To find out your arrival time please call [phone] between 1PM - 3PM on 08/14/21.  Remember: Instructions that are not followed completely may result in serious medical risk, up to and including death, or upon the discretion of your surgeon and anesthesiologist your surgery may need to be rescheduled.     _X__ 1. Do not eat food after midnight the night before your procedure.                 No gum chewing or hard candies. You may drink clear liquids up to 2 hours                 before you are scheduled to arrive for your surgery- DO not drink clear                 liquids within 2 hours of the start of your surgery.                 Clear Liquids include:  water, apple juice without pulp, clear carbohydrate                 drink such as Clearfast or Gatorade, Black Coffee or Tea (Do not add                 anything to coffee or tea). Diabetics water only  __X__2.  On the morning of surgery brush your teeth with toothpaste and water, you                 may rinse your mouth with mouthwash if you wish.  Do not swallow any              toothpaste of mouthwash.     _X__ 3.  No Alcohol for 24 hours before or after surgery.   _X__ 4.  Do Not Smoke or use e-cigarettes For 24 Hours Prior to Your Surgery.                 Do not use any chewable tobacco products for at least 6 hours prior to                 surgery.  ____  5.  Bring all medications with you on the day of surgery if instructed.   __X__  6.  Notify your doctor if there is any change in your medical condition      (cold, fever, infections).     Do not wear jewelry, make-up, hairpins, clips or nail polish. Do not wear lotions, powders, or perfumes.  Do not shave body hair 48 hours prior to surgery. Men may shave face and neck. Do not bring valuables to the hospital.    [facility] is not responsible for any  belongings or valuables.  Contacts, dentures/partials or body piercings may not be worn into surgery. Bring a case for your contacts, glasses or hearing aids, a denture cup will be supplied. Leave your suitcase in the car. After surgery it may be brought to your room. For patients admitted to the hospital, discharge time is determined by your treatment team.   Patients discharged the day of surgery will not be allowed to drive home.    __X__ Take these medicines the morning of surgery with A SIP OF WATER:    1. none  2.   3.   4.  5.  6.  ____ Fleet Enema (as directed)   ____ Use CHG Soap/SAGE wipes as directed  __X__ Use inhalers on the day of surgery  ____ Stop metformin/Janumet/Farxiga 2 days prior to surgery    ____ Take 1/2 of usual insulin dose the night before surgery. No insulin the morning          of surgery.   ____ Stop Blood Thinners Coumadin/Plavix/Xarelto/Pleta/Pradaxa/Eliquis/Effient/Aspirin  on   Or contact your Surgeon, Cardiologist or Medical Doctor regarding  ability to stop your blood thinners  __X__ Stop Anti-inflammatories 7 days before surgery such as Advil, Ibuprofen, Motrin,  BC or Goodies Powder, Naprosyn, Naproxen, Aleve, Aspirin    __X__ Stop all herbals and supplements, fish oil or vitamins  until after surgery.    ____ Bring C-Pap to the hospital.

## 2021-08-14 MED ORDER — CHLORHEXIDINE GLUCONATE 0.12 % MT SOLN: 15.0000 mL | Freq: Once | OROMUCOSAL | Status: AC

## 2021-08-14 MED ORDER — LACTATED RINGERS IV SOLN: INTRAVENOUS | Status: DC

## 2021-08-14 MED ORDER — FAMOTIDINE 20 MG PO TABS: 20.0000 mg | ORAL_TABLET | Freq: Once | ORAL | Status: AC

## 2021-08-14 MED ORDER — ORAL CARE MOUTH RINSE: 15.0000 mL | Freq: Once | OROMUCOSAL | Status: AC

## 2021-08-15 ENCOUNTER — Ambulatory Visit: Payer: Medicaid Other | Admitting: Anesthesiology

## 2021-08-15 ENCOUNTER — Other Ambulatory Visit: Payer: Self-pay

## 2021-08-15 ENCOUNTER — Encounter: Payer: Self-pay | Admitting: Otolaryngology

## 2021-08-15 ENCOUNTER — Encounter
Admission: RE | Disposition: A | Discharge status: Home / Self Care | Payer: Self-pay | Source: Home / Self Care | Attending: Otolaryngology

## 2021-08-15 ENCOUNTER — Ambulatory Visit
Admission: RE | Admit: 2021-08-15 | Discharge: 2021-08-15 | Disposition: A | Discharge status: Home / Self Care | Payer: Medicaid Other | Attending: Otolaryngology | Admitting: Otolaryngology

## 2021-08-15 DIAGNOSIS — J45909 Unspecified asthma, uncomplicated: Secondary | ICD-10-CM | POA: Diagnosis not present

## 2021-08-15 DIAGNOSIS — F319 Bipolar disorder, unspecified: Secondary | ICD-10-CM | POA: Insufficient documentation

## 2021-08-15 DIAGNOSIS — F17200 Nicotine dependence, unspecified, uncomplicated: Secondary | ICD-10-CM | POA: Insufficient documentation

## 2021-08-15 DIAGNOSIS — M419 Scoliosis, unspecified: Secondary | ICD-10-CM | POA: Insufficient documentation

## 2021-08-15 DIAGNOSIS — Z01818 Encounter for other preprocedural examination: Secondary | ICD-10-CM

## 2021-08-15 DIAGNOSIS — S022XXA Fracture of nasal bones, initial encounter for closed fracture: Secondary | ICD-10-CM | POA: Insufficient documentation

## 2021-08-15 DIAGNOSIS — J342 Deviated nasal septum: Secondary | ICD-10-CM | POA: Insufficient documentation

## 2021-08-15 HISTORY — PX: SEPTOPLASTY: SHX2393

## 2021-08-15 HISTORY — PX: CLOSED REDUCTION NASAL FRACTURE: SHX5365

## 2021-08-15 LAB — POCT PREGNANCY, URINE: Preg Test, Ur: NEGATIVE

## 2021-08-15 SURGERY — SEPTOPLASTY, NOSE
Anesthesia: General

## 2021-08-15 MED ORDER — DEXMEDETOMIDINE (PRECEDEX) IN NS 20 MCG/5ML (4 MCG/ML) IV SYRINGE
PREFILLED_SYRINGE | INTRAVENOUS | Status: DC | PRN
  Administered 2021-08-15: 4 ug via INTRAVENOUS
  Administered 2021-08-15 (×2): 8 ug via INTRAVENOUS

## 2021-08-15 MED ORDER — BACITRACIN ZINC 500 UNIT/GM EX OINT
TOPICAL_OINTMENT | CUTANEOUS | Status: AC
Start: 2021-08-15 — End: ?
  Filled 2021-08-15: qty 28.35

## 2021-08-15 MED ORDER — OXYMETAZOLINE HCL 0.05 % NA SOLN
NASAL | Status: DC | PRN
  Administered 2021-08-15: 1

## 2021-08-15 MED ORDER — ONDANSETRON HCL 4 MG/2ML IJ SOLN: 4.0000 mg | Freq: Once | INTRAMUSCULAR | Status: DC | PRN

## 2021-08-15 MED ORDER — OXYCODONE HCL 5 MG/5ML PO SOLN: 5.0000 mg | Freq: Once | ORAL | Status: AC | PRN

## 2021-08-15 MED ORDER — PROPOFOL 10 MG/ML IV BOLUS
INTRAVENOUS | Status: DC | PRN
  Administered 2021-08-15: 180 mg via INTRAVENOUS

## 2021-08-15 MED ORDER — 0.9 % SODIUM CHLORIDE (POUR BTL) OPTIME
TOPICAL | Status: DC | PRN
  Administered 2021-08-15: 150 mL

## 2021-08-15 MED ORDER — LACTATED RINGERS IV SOLN: INTRAVENOUS | Status: DC

## 2021-08-15 MED ORDER — PROPOFOL 500 MG/50ML IV EMUL
INTRAVENOUS | Status: DC | PRN
  Administered 2021-08-15: 50 ug/kg/min via INTRAVENOUS

## 2021-08-15 MED ORDER — SUGAMMADEX SODIUM 200 MG/2ML IV SOLN
INTRAVENOUS | Status: DC | PRN
  Administered 2021-08-15: 200 mg via INTRAVENOUS

## 2021-08-15 MED ORDER — FENTANYL CITRATE (PF) 100 MCG/2ML IJ SOLN
25.0000 ug | INTRAMUSCULAR | Status: DC | PRN
  Administered 2021-08-15 (×3): 25 ug via INTRAVENOUS

## 2021-08-15 MED ORDER — KETOROLAC TROMETHAMINE 30 MG/ML IJ SOLN
INTRAMUSCULAR | Status: AC
  Filled 2021-08-15: qty 1

## 2021-08-15 MED ORDER — ACETAMINOPHEN 10 MG/ML IV SOLN
INTRAVENOUS | Status: DC | PRN
  Administered 2021-08-15: 1000 mg via INTRAVENOUS

## 2021-08-15 MED ORDER — OXYMETAZOLINE HCL 0.05 % NA SOLN
NASAL | Status: AC
  Filled 2021-08-15: qty 30

## 2021-08-15 MED ORDER — GLYCOPYRROLATE 0.2 MG/ML IJ SOLN
INTRAMUSCULAR | Status: DC | PRN
  Administered 2021-08-15: .2 mg via INTRAVENOUS

## 2021-08-15 MED ORDER — ROCURONIUM BROMIDE 100 MG/10ML IV SOLN
INTRAVENOUS | Status: DC | PRN
  Administered 2021-08-15: 30 mg via INTRAVENOUS

## 2021-08-15 MED ORDER — MIDAZOLAM HCL 2 MG/2ML IJ SOLN
INTRAMUSCULAR | Status: AC
  Filled 2021-08-15: qty 2

## 2021-08-15 MED ORDER — MIDAZOLAM HCL 2 MG/2ML IJ SOLN
INTRAMUSCULAR | Status: DC | PRN
  Administered 2021-08-15: 2 mg via INTRAVENOUS

## 2021-08-15 MED ORDER — OXYCODONE HCL 5 MG PO TABS
5.0000 mg | ORAL_TABLET | Freq: Once | ORAL | Status: AC | PRN
  Administered 2021-08-15: 5 mg via ORAL

## 2021-08-15 MED ORDER — FENTANYL CITRATE (PF) 100 MCG/2ML IJ SOLN
INTRAMUSCULAR | Status: AC
  Filled 2021-08-15: qty 2

## 2021-08-15 MED ORDER — HEMOSTATIC AGENTS (NO CHARGE) OPTIME
TOPICAL | Status: DC | PRN
  Administered 2021-08-15: 1 via TOPICAL

## 2021-08-15 MED ORDER — CEPHALEXIN 500 MG PO CAPS: 500.0000 mg | ORAL_CAPSULE | Freq: Two times a day (BID) | ORAL | 0 refills | Status: DC

## 2021-08-15 MED ORDER — ACETAMINOPHEN 10 MG/ML IV SOLN
INTRAVENOUS | Status: AC
  Filled 2021-08-15: qty 100

## 2021-08-15 MED ORDER — ONDANSETRON HCL 4 MG/2ML IJ SOLN
INTRAMUSCULAR | Status: DC | PRN
  Administered 2021-08-15: 4 mg via INTRAVENOUS

## 2021-08-15 MED ORDER — FENTANYL CITRATE (PF) 100 MCG/2ML IJ SOLN
INTRAMUSCULAR | Status: AC
  Administered 2021-08-15: 25 ug via INTRAVENOUS
  Filled 2021-08-15: qty 2

## 2021-08-15 MED ORDER — OXYCODONE HCL 5 MG PO TABS
ORAL_TABLET | ORAL | Status: AC
  Filled 2021-08-15: qty 1

## 2021-08-15 MED ORDER — PHENYLEPHRINE HCL (PRESSORS) 10 MG/ML IV SOLN
INTRAVENOUS | Status: DC | PRN
  Administered 2021-08-15: 160 ug via INTRAVENOUS
  Administered 2021-08-15: 80 ug via INTRAVENOUS
  Administered 2021-08-15: 40 ug via INTRAVENOUS
  Administered 2021-08-15 (×3): 80 ug via INTRAVENOUS

## 2021-08-15 MED ORDER — DEXAMETHASONE SODIUM PHOSPHATE 10 MG/ML IJ SOLN
INTRAMUSCULAR | Status: DC | PRN
  Administered 2021-08-15: 10 mg via INTRAVENOUS

## 2021-08-15 MED ORDER — PROPOFOL 1000 MG/100ML IV EMUL
INTRAVENOUS | Status: AC
  Filled 2021-08-15: qty 100

## 2021-08-15 MED ORDER — LIDOCAINE HCL (CARDIAC) PF 100 MG/5ML IV SOSY
PREFILLED_SYRINGE | INTRAVENOUS | Status: DC | PRN
  Administered 2021-08-15: 60 mg via INTRAVENOUS

## 2021-08-15 MED ORDER — CHLORHEXIDINE GLUCONATE 0.12 % MT SOLN
OROMUCOSAL | Status: AC
  Administered 2021-08-15: 15 mL via OROMUCOSAL
  Filled 2021-08-15: qty 15

## 2021-08-15 MED ORDER — HYDROCODONE-ACETAMINOPHEN 5-325 MG PO TABS: 1.0000 | ORAL_TABLET | Freq: Four times a day (QID) | ORAL | 0 refills | Status: DC | PRN

## 2021-08-15 MED ORDER — FENTANYL CITRATE (PF) 100 MCG/2ML IJ SOLN
INTRAMUSCULAR | Status: DC | PRN
  Administered 2021-08-15: 50 ug via INTRAVENOUS

## 2021-08-15 MED ORDER — LIDOCAINE-EPINEPHRINE (PF) 1 %-1:200000 IJ SOLN
INTRAMUSCULAR | Status: DC | PRN
  Administered 2021-08-15: 5 mL

## 2021-08-15 MED ORDER — FAMOTIDINE 20 MG PO TABS
ORAL_TABLET | ORAL | Status: AC
  Administered 2021-08-15: 20 mg via ORAL
  Filled 2021-08-15: qty 1

## 2021-08-15 MED ORDER — LIDOCAINE-EPINEPHRINE (PF) 1 %-1:200000 IJ SOLN
INTRAMUSCULAR | Status: AC
  Filled 2021-08-15: qty 30

## 2021-08-15 MED ORDER — ACETAMINOPHEN 10 MG/ML IV SOLN: 1000.0000 mg | Freq: Once | INTRAVENOUS | Status: DC | PRN

## 2021-08-15 SURGICAL SUPPLY — 27 items
AQUAPLAST 3X3 FLAT (MISCELLANEOUS) ×2
BLADE SURG 15 STRL LF DISP TIS (BLADE) IMPLANT
BLADE SURG 15 STRL SS (BLADE) ×1
ELECT REM PT RETURN 9FT ADLT (ELECTROSURGICAL) ×2
ELECTRODE REM PT RTRN 9FT ADLT (ELECTROSURGICAL) ×1 IMPLANT
GAUZE 4X4 16PLY ~~LOC~~+RFID DBL (SPONGE) ×2 IMPLANT
GLOVE BIO SURGEON STRL SZ7.5 (GLOVE) ×2 IMPLANT
GOWN STRL REUS W/ TWL LRG LVL3 (GOWN DISPOSABLE) ×2 IMPLANT
GOWN STRL REUS W/TWL LRG LVL3 (GOWN DISPOSABLE) ×2
KIT TURNOVER KIT A (KITS) ×2 IMPLANT
MANIFOLD NEPTUNE II (INSTRUMENTS) ×2 IMPLANT
NDL HYPO 27GX1-1/4 (NEEDLE) ×1 IMPLANT
NDL MAYO 6 CRC TAPER PT (NEEDLE) IMPLANT
NEEDLE HYPO 27GX1-1/4 (NEEDLE) ×2 IMPLANT
NEEDLE MAYO 6 CRC TAPER PT (NEEDLE) ×2 IMPLANT
NS IRRIG 500ML POUR BTL (IV SOLUTION) ×2 IMPLANT
PACK HEAD/NECK (MISCELLANEOUS) ×2 IMPLANT
PACKING NASAL EPIS 4X2.4 XEROG (MISCELLANEOUS) ×1 IMPLANT
SPLINT AQUAPLAST 3X3 FLAT (MISCELLANEOUS) IMPLANT
SPONGE NEURO XRAY DETECT 1X3 (DISPOSABLE) ×2 IMPLANT
STRIP CLOSURE SKIN 1/4X4 (GAUZE/BANDAGES/DRESSINGS) ×1 IMPLANT
SUT CHROMIC 4 0 RB 1X27 (SUTURE) ×2 IMPLANT
SUT ETHILON 4-0 (SUTURE) ×1
SUT ETHILON 4-0 FS2 18XMFL BLK (SUTURE) ×1
SUTURE ETHLN 4-0 FS2 18XMF BLK (SUTURE) ×1 IMPLANT
TOWEL OR 17X26 4PK STRL BLUE (TOWEL DISPOSABLE) ×4 IMPLANT
WATER STERILE IRR 500ML POUR (IV SOLUTION) ×2 IMPLANT

## 2021-08-15 NOTE — Anesthesia Preprocedure Evaluation (Addendum)
Anesthesia Evaluation  Patient identified by MRN, date of birth, ID band Patient awake    Reviewed: Allergy & Precautions, NPO status , Patient's Chart, lab work & pertinent test results  History of Anesthesia Complications Negative for: history of anesthetic complications  Airway Mallampati: III   Neck ROM: Full    Dental  (+) Chipped   Pulmonary asthma , Current Smoker (occasional) and Patient abstained from smoking.,    Pulmonary exam normal breath sounds clear to auscultation       Cardiovascular Exercise Tolerance: Good negative cardio ROS Normal cardiovascular exam Rhythm:Regular Rate:Normal     Neuro/Psych PSYCHIATRIC DISORDERS Anxiety Bipolar Disorder Scoliosis     GI/Hepatic negative GI ROS,   Endo/Other  negative endocrine ROS  Renal/GU negative Renal ROS     Musculoskeletal   Abdominal   Peds  Hematology negative hematology ROS (+)   Anesthesia Other Findings   Reproductive/Obstetrics                            Anesthesia Physical Anesthesia Plan  ASA: 2  Anesthesia Plan: General   Post-op Pain Management:    Induction: Intravenous  PONV Risk Score and Plan: 2 and Ondansetron, Dexamethasone and Treatment may vary due to age or medical condition  Airway Management Planned: Oral ETT  Additional Equipment:   Intra-op Plan:   Post-operative Plan: Extubation in OR  Informed Consent: I have reviewed the patients History and Physical, chart, labs and discussed the procedure including the risks, benefits and alternatives for the proposed anesthesia with the patient or authorized representative who has indicated his/her understanding and acceptance.     Dental advisory given  Plan Discussed with: CRNA  Anesthesia Plan Comments: (Patient consented for risks of anesthesia including but not limited to:  - adverse reactions to medications - damage to eyes, teeth, lips  or other oral mucosa - nerve damage due to positioning  - sore throat or hoarseness - damage to heart, brain, nerves, lungs, other parts of body or loss of life  Informed patient about role of CRNA in peri- and intra-operative care.  Patient voiced understanding.)        Anesthesia Quick Evaluation

## 2021-08-15 NOTE — Transfer of Care (Signed)
Immediate Anesthesia Transfer of Care Note  Patient: Carolyn Dawson  Procedure(s) Performed: SEPTOPLASTY CLOSED REDUCTION NASAL FRACTURE  Patient Location: PACU  Anesthesia Type:General  Level of Consciousness: drowsy  Airway & Oxygen Therapy: Patient Spontanous Breathing and Patient connected to face mask oxygen  Post-op Assessment: Report given to RN and Post -op Vital signs reviewed and stable  Post vital signs: Reviewed and stable  Last Vitals:  Vitals Value Taken Time  BP 98/58 08/15/21 1315  Temp 36.3 C 08/15/21 1313  Pulse 87 08/15/21 1321  Resp 16 08/15/21 1321  SpO2 100 % 08/15/21 1321  Vitals shown include unvalidated device data.  Last Pain:  Vitals:   08/15/21 1313  TempSrc:   PainSc: Asleep         Complications: No notable events documented.

## 2021-08-15 NOTE — Op Note (Signed)
08/15/2021  1:01 PM    [name]  [id number]   Pre-Op Diagnosis:  Nasal fracture with septal deviation   Post-op Diagnosis: Same  Procedure: 1) Closed reduction of nasal fracture, 2) nasal Septoplasty  Surgeon:  Sandi Mealy  Anesthesia:  General endotracheal  EBL: Less than 20 cc  Complications:  None  Findings: Right septal deviation.  Depressed right nasal bone fracture Procedure: The patient was taken to the Operating Room and placed in the supine position.  After induction of general endotracheal anesthesia, the table was turned 90 degrees. A time-out was issued to confirm the site and procedure. The nasal septum and lateral nasal wall where then injected with 1% lidocaine with epinephrine, 1:100,000. The nose was decongested with Afrin soaked pledgets. The patient was then prepped and draped in the usual sterile fashion.  The nasal dorsum was inspected.  The right nasal bone was fractured with depression of the nasal bone.  This was reduced using a Engineer, mining.  Intranasally the septum was deviated to the right.  Attempts were made to reduce the septal fracture with closed reduction alone however the septum remained deviated to the right, predominantly due to residual bony deflection as well as the cartilage being deviated over.  Beginning on the left hand side a hemitransfixion incision was then created on the leading edge of the septum on the left.  A subperichondrial plane was elevated posteriorly on the left and taken back to the perpendicular plate of the ethmoid where subperiosteal plane was elevated posteriorly on the left. The perpendicular plate of the ethmoid was separated from the quadrangular cartilage, and a subperiosteal plane elevated on the right of the deviated bony septum. The deviated portion of the bony septum was removed, dividing the septal bone superiorly and inferiorly with Knight scissors.  This allowed the cartilage to relax more into the midline.  There  was still some deviated and fractured cartilage midway back along the septum and a small portion of this was resected.  The remainder of the cartilage reduced to the midline.  There was some fracturing of the dorsal tip of the cartilage which was reduced by releasing it from the perichondrium and allowing it to relax back into the midline.  The septum was then replaced in the midline. Reinspection through each nostril showed excellent reduction of the septal deformity. A left posterior inferior fenestration was present from the injury and left loosely open to allow hematoma drainage.  The septal incision was closed with 4-0 chromic gut suture. A 4-0 plain gut suture was used to reapproximate the septal flaps to the underlying cartilage, utilizing a running, quilting type stitch, loosely closing the mucoperichondrial fenestration during the closure.   With the septoplasty completed and no active bleeding, nasal septal splints were placed within each nostril and affixed to the septum using a 3-0 nylon suture.  The nasal bones were reinspected.  A small amount of absorbable xerogel was placed on the right side under the nasal bone to provide some support during healing.  An Aquaplast splint was then placed after placing Mastisol and Steri-Strips on the skin of the nasal dorsum.  The patient was then returned to the anesthesiologist for awakening, and was taken to the Recovery Room in stable condition.  Disposition:   PACU to home  Plan: Take pain medications and antibiotics as prescribed. No strenuous activity for 2 weeks. Follow-up in 1 week for splint removal.  May rinse nose with saline 3-4 times daily to  help clear secretions.  Do not blow nose.  Sandi Mealy 08/15/2021 1:01 PM

## 2021-08-15 NOTE — Discharge Instructions (Signed)

## 2021-08-15 NOTE — H&P (Signed)
History and physical reviewed and will be scanned in later. No change in medical status reported by the patient or family, appears stable for surgery. All questions regarding the procedure answered, and patient (or family if a child) expressed understanding of the procedure. ? ?Carolyn Dawson S Carolyn Dawson ?@TODAY@ ?

## 2021-08-15 NOTE — Anesthesia Procedure Notes (Signed)
Procedure Name: Intubation Date/Time: 08/15/2021 12:00 PM  Performed by: Jacky Hartung, CRNAPre-anesthesia Checklist: Patient identified, Patient being monitored, Timeout performed, Emergency Drugs available and Suction available Patient Re-evaluated:Patient Re-evaluated prior to induction Oxygen Delivery Method: Circle system utilized Preoxygenation: Pre-oxygenation with 100% oxygen Induction Type: IV induction Ventilation: Mask ventilation without difficulty Laryngoscope Size: Miller and 2 Grade View: Grade I Tube type: Oral Rae Tube size: 7.0 mm Number of attempts: 1 Airway Equipment and Method: Stylet Placement Confirmation: ETT inserted through vocal cords under direct vision, positive ETCO2 and breath sounds checked- equal and bilateral Secured at: 21 cm Tube secured with: Tape Dental Injury: Teeth and Oropharynx as per pre-operative assessment

## 2021-08-15 NOTE — Anesthesia Postprocedure Evaluation (Signed)
Anesthesia Post Note  Patient: Carolyn Dawson  Procedure(s) Performed: SEPTOPLASTY CLOSED REDUCTION NASAL FRACTURE  Patient location during evaluation: PACU Anesthesia Type: General Level of consciousness: awake and alert, oriented and patient cooperative Pain management: pain level controlled Vital Signs Assessment: post-procedure vital signs reviewed and stable Respiratory status: spontaneous breathing, nonlabored ventilation and respiratory function stable Cardiovascular status: blood pressure returned to baseline and stable Postop Assessment: adequate PO intake Anesthetic complications: no   No notable events documented.   Last Vitals:  Vitals:   08/15/21 1400 08/15/21 1415  BP: 118/75 122/83  Pulse: (!) 56 60  Resp: 12 10  Temp:    SpO2: 100% 99%    Last Pain:  Vitals:   08/15/21 1415  TempSrc:   PainSc: 4                  Reed Breech

## 2021-08-16 ENCOUNTER — Encounter: Payer: Self-pay | Admitting: Otolaryngology

## 2021-08-18 ENCOUNTER — Encounter: Payer: Self-pay | Admitting: Emergency Medicine

## 2021-08-18 ENCOUNTER — Emergency Department
Admission: EM | Admit: 2021-08-18 | Discharge: 2021-08-18 | Discharge status: Against Medical Advice | Payer: Medicaid Other | Attending: Student in an Organized Health Care Education/Training Program | Admitting: Student in an Organized Health Care Education/Training Program

## 2021-08-18 ENCOUNTER — Other Ambulatory Visit: Payer: Self-pay

## 2021-08-18 DIAGNOSIS — Z5321 Procedure and treatment not carried out due to patient leaving prior to being seen by health care provider: Secondary | ICD-10-CM | POA: Diagnosis not present

## 2021-08-18 DIAGNOSIS — R202 Paresthesia of skin: Secondary | ICD-10-CM | POA: Insufficient documentation

## 2021-08-18 DIAGNOSIS — R109 Unspecified abdominal pain: Secondary | ICD-10-CM | POA: Diagnosis not present

## 2021-08-18 LAB — COMPREHENSIVE METABOLIC PANEL
ALT: 9 U/L (ref 0–44)
AST: 14 U/L — ABNORMAL LOW (ref 15–41)
Albumin: 4.5 g/dL (ref 3.5–5.0)
Alkaline Phosphatase: 23 U/L — ABNORMAL LOW (ref 38–126)
Anion gap: 8 (ref 5–15)
BUN: 10 mg/dL (ref 6–20)
CO2: 24 mmol/L (ref 22–32)
Calcium: 9.3 mg/dL (ref 8.9–10.3)
Chloride: 106 mmol/L (ref 98–111)
Creatinine, Ser: 0.75 mg/dL (ref 0.44–1.00)
GFR, Estimated: >60 mL/min (ref >60)
Glucose, Bld: 89 mg/dL (ref 70–99)
Potassium: 3.7 mmol/L (ref 3.5–5.1)
Sodium: 138 mmol/L (ref 135–145)
Total Bilirubin: 0.5 mg/dL (ref 0.3–1.2)
Total Protein: 7.6 g/dL (ref 6.5–8.1)

## 2021-08-18 LAB — LIPASE, BLOOD: Lipase: 28 U/L (ref 11–51)

## 2021-08-18 LAB — CBC
HCT: 39.2 % (ref 36.0–46.0)
Hemoglobin: 12.3 g/dL (ref 12.0–15.0)
MCH: 26.7 pg (ref 26.0–34.0)
MCHC: 31.4 g/dL (ref 30.0–36.0)
MCV: 85 fL (ref 80.0–100.0)
Platelets: 192 10*3/uL (ref 150–400)
RBC: 4.61 MIL/uL (ref 3.87–5.11)
RDW: 12.2 % (ref 11.5–15.5)
WBC: 6.5 10*3/uL (ref 4.0–10.5)
nRBC: 0 % (ref 0.0–0.2)

## 2021-08-18 NOTE — ED Triage Notes (Signed)
Pt to desk asking about wait time and when surgeon would be here to see her. Pt states she called and talked to someone and was told her surgeon would be here waiting on her. Advised pt that this conversation occurred prior to this shift and this nurse would need to follow up in order to be able to answer her questions as I had no knowledge of who she talked to or what was said. Pt then says " it dont matter, Im prob about to leave anyway, this is ridiculous." And walked away from desk before this nurse could respond.

## 2021-08-18 NOTE — ED Triage Notes (Signed)
Patient reports having nasal surgery on Tuesday. States since then she has only been able to tolerate water. When attempting to drink anything else or eat soft foods it burns and has a tingling sensation in her throat. Report generalized abdominal pain.

## 2021-09-27 ENCOUNTER — Ambulatory Visit: Payer: Medicaid Other

## 2021-10-23 ENCOUNTER — Emergency Department: Payer: Medicaid Other

## 2021-10-23 ENCOUNTER — Emergency Department
Admission: EM | Admit: 2021-10-23 | Discharge: 2021-10-23 | Disposition: A | Discharge status: Home / Self Care | Payer: Medicaid Other | Attending: Emergency Medicine | Admitting: Emergency Medicine

## 2021-10-23 ENCOUNTER — Other Ambulatory Visit: Payer: Self-pay

## 2021-10-23 ENCOUNTER — Encounter: Payer: Self-pay | Admitting: Emergency Medicine

## 2021-10-23 DIAGNOSIS — R1084 Generalized abdominal pain: Secondary | ICD-10-CM | POA: Diagnosis present

## 2021-10-23 DIAGNOSIS — J45909 Unspecified asthma, uncomplicated: Secondary | ICD-10-CM | POA: Diagnosis not present

## 2021-10-23 DIAGNOSIS — R0789 Other chest pain: Secondary | ICD-10-CM | POA: Insufficient documentation

## 2021-10-23 LAB — URINALYSIS, ROUTINE W REFLEX MICROSCOPIC
Bacteria, UA: NONE SEEN
Bilirubin Urine: NEGATIVE
Glucose, UA: NEGATIVE mg/dL
Ketones, ur: NEGATIVE mg/dL
Leukocytes,Ua: NEGATIVE
Nitrite: NEGATIVE
Protein, ur: NEGATIVE mg/dL
Specific Gravity, Urine: 1.025 (ref 1.005–1.030)
pH: 6 (ref 5.0–8.0)

## 2021-10-23 LAB — CBC
HCT: 38.3 % (ref 36.0–46.0)
Hemoglobin: 11.5 g/dL — ABNORMAL LOW (ref 12.0–15.0)
MCH: 26.6 pg (ref 26.0–34.0)
MCHC: 30 g/dL (ref 30.0–36.0)
MCV: 88.7 fL (ref 80.0–100.0)
Platelets: 189 10*3/uL (ref 150–400)
RBC: 4.32 MIL/uL (ref 3.87–5.11)
RDW: 12.8 % (ref 11.5–15.5)
WBC: 5.5 10*3/uL (ref 4.0–10.5)
nRBC: 0 % (ref 0.0–0.2)

## 2021-10-23 LAB — COMPREHENSIVE METABOLIC PANEL
ALT: 10 U/L (ref 0–44)
AST: 19 U/L (ref 15–41)
Albumin: 4.1 g/dL (ref 3.5–5.0)
Alkaline Phosphatase: 25 U/L — ABNORMAL LOW (ref 38–126)
Anion gap: 6 (ref 5–15)
BUN: 17 mg/dL (ref 6–20)
CO2: 25 mmol/L (ref 22–32)
Calcium: 9.3 mg/dL (ref 8.9–10.3)
Chloride: 109 mmol/L (ref 98–111)
Creatinine, Ser: 0.8 mg/dL (ref 0.44–1.00)
GFR, Estimated: >60 mL/min (ref >60)
Glucose, Bld: 93 mg/dL (ref 70–99)
Potassium: 4.1 mmol/L (ref 3.5–5.1)
Sodium: 140 mmol/L (ref 135–145)
Total Bilirubin: 0.4 mg/dL (ref 0.3–1.2)
Total Protein: 7.1 g/dL (ref 6.5–8.1)

## 2021-10-23 LAB — TROPONIN I (HIGH SENSITIVITY): Troponin I (High Sensitivity): <2 ng/L (ref <18)

## 2021-10-23 LAB — POC URINE PREG, ED: Preg Test, Ur: NEGATIVE

## 2021-10-23 LAB — LIPASE, BLOOD: Lipase: 33 U/L (ref 11–51)

## 2021-10-23 MED ORDER — LIDOCAINE 5 % EX PTCH: 1.0000 | MEDICATED_PATCH | Freq: Two times a day (BID) | CUTANEOUS | 0 refills | Status: AC

## 2021-10-23 MED ORDER — LIDOCAINE 5 % EX PTCH
1.0000 | MEDICATED_PATCH | CUTANEOUS | Status: DC
  Administered 2021-10-23: 1 via TRANSDERMAL
  Filled 2021-10-23: qty 1

## 2021-10-23 NOTE — ED Provider Notes (Signed)
[facility] Provider Note    Event Date/Time   First MD Initiated Contact with Patient 10/23/21 [phone]     (approximate)   History   Chief Complaint Abdominal Pain   HPI  Carolyn Dawson is a 31 y.o. female with past medical history of bipolar disorder, asthma, anemia, and anxiety who presents to the ED complaining of abdominal pain.  Patient reports that she has had about 2 weeks of intermittent diffuse abdominal pain that she describes as crampy.  She states that it reminds her of period cramps but she is now 5 days late for her typical period.  She denies any nausea or vomiting and states she has been eating and drinking without difficulty.  She has not had any changes in her bowel movements and denies any dysuria, hematuria, fever, flank pain, vaginal bleeding, or discharge.  She also reports about 4 days of constant pain along her right lower chest wall.  She describes this pain as a pulling that is worse when she raises up her right arm.  She denies any falls or other trauma to this area, has not had any cough or shortness of breath.      Physical Exam   Triage Vital Signs: ED Triage Vitals  Enc Vitals Group     BP 10/23/21 0737 96/70     Pulse Rate 10/23/21 0737 94     Resp 10/23/21 0737 18     Temp 10/23/21 0737 97.8 F (36.6 C)     Temp Source 10/23/21 0737 Oral     SpO2 10/23/21 0737 100 %     Weight 10/23/21 0733 122 lb (55.3 kg)     Height 10/23/21 0733 5\' 1"  (1.549 m)     Head Circumference --      Peak Flow --      Pain Score 10/23/21 0733 0     Pain Loc --      Pain Edu? --      Excl. in GC? --     Most recent vital signs: Vitals:   10/23/21 0737  BP: 96/70  Pulse: 94  Resp: 18  Temp: 97.8 F (36.6 C)  SpO2: 100%    Constitutional: Alert and oriented. Eyes: Conjunctivae are normal. Head: Atraumatic. Nose: No congestion/rhinnorhea. Mouth/Throat: Mucous membranes are moist.  Cardiovascular: Normal rate, regular rhythm.  Grossly normal heart sounds.  2+ radial pulses bilaterally. Respiratory: Normal respiratory effort.  No retractions. Lungs CTAB.  No chest wall tenderness to palpation noted, no rash noted. Gastrointestinal: Soft and nontender. No distention. Musculoskeletal: No lower extremity tenderness nor edema.  Neurologic:  Normal speech and language. No gross focal neurologic deficits are appreciated.    ED Results / Procedures / Treatments   Labs (all labs ordered are listed, but only abnormal results are displayed) Labs Reviewed  COMPREHENSIVE METABOLIC PANEL - Abnormal; Notable for the following components:      Result Value   Alkaline Phosphatase 25 (*)    All other components within normal limits  CBC - Abnormal; Notable for the following components:   Hemoglobin 11.5 (*)    All other components within normal limits  URINALYSIS, ROUTINE W REFLEX MICROSCOPIC - Abnormal; Notable for the following components:   Color, Urine YELLOW (*)    APPearance CLEAR (*)    Hgb urine dipstick SMALL (*)    All other components within normal limits  LIPASE, BLOOD  POC URINE PREG, ED  TROPONIN I (HIGH SENSITIVITY)  EKG  ED ECG REPORT I, Blake Divine, the attending physician, personally viewed and interpreted this ECG.   Date: 10/23/2021  EKG Time: 8:12  Rate: 83  Rhythm: normal sinus rhythm  Axis: Normal  Intervals:none  ST&T Change: None  RADIOLOGY Chest x-ray reviewed and interpreted by me with no infiltrate, edema, or effusion.  PROCEDURES:  Critical Care performed: No  Procedures   MEDICATIONS ORDERED IN ED: Medications  lidocaine (LIDODERM) 5 % 1 patch (1 patch Transdermal Patch Applied 10/23/21 0813)     IMPRESSION / MDM / ASSESSMENT AND PLAN / ED COURSE  I reviewed the triage vital signs and the nursing notes.                              31 y.o. female with past medical history of bipolar disorder, asthma, anemia, and anxiety who presents to the ED complaining of  2 weeks of intermittent diffuse crampy abdominal pain along with right lower chest wall pain worse when she raises her right arm.  Patient's presentation is most consistent with acute presentation with potential threat to life or bodily function.  Differential diagnosis includes, but is not limited to, ACS, PE, pneumonia, dissection, pneumothorax, hepatitis, cholecystitis, biliary colic, ectopic pregnancy, UTI.  Patient well-appearing and in no acute distress, vital signs are unremarkable.  She currently denies any abdominal pain and has no tenderness whatsoever on examination.  With 2 weeks of symptoms and reassuring exam, do not feel CT imaging is indicated, labs and urinalysis are pending but pregnancy testing is negative.  For her right chest wall pain, symptoms are very atypical for ACS and seem most likely to be musculoskeletal given symptoms are worse when she raises her right arm.  We will screen EKG, chest x-ray, and troponin.  Low suspicion for PE as patient is PERC negative.  Plan to treat symptomatically with Lidoderm patch and reassess following labs and imaging.  EKG shows no evidence of arrhythmia or ischemia, chest x-ray also unremarkable and troponin within normal limits.  Suspect her chest wall pain is musculoskeletal in etiology and patient was counseled to continue Lidoderm patches at home, also alternate Tylenol and ibuprofen.  Pregnancy testing is negative, urinalysis shows no signs of infection.  Remainder of labs are also reassuring with no significant anemia, leukocytosis, electrolyte abnormality, or AKI.  LFTs and lipase are unremarkable and patient continues to deny any abdominal pain currently.  She is appropriate for discharge home with PCP follow-up, was counseled to return to the ED for new or worsening symptoms, patient agrees with plan.      FINAL CLINICAL IMPRESSION(S) / ED DIAGNOSES   Final diagnoses:  Generalized abdominal pain  Chest wall pain     Rx / DC  Orders   ED Discharge Orders          Ordered    lidocaine (LIDODERM) 5 %  Every 12 hours        10/23/21 0925             Note:  This document was prepared using Dragon voice recognition software and may include unintentional dictation errors.   Blake Divine, MD 10/23/21 [phone]

## 2021-10-23 NOTE — ED Triage Notes (Signed)
Pt via POV from home. Pt have multiple complaints. Pt c/o generalized abd pain, nausea intermittently for the last 2 weeks. Pt also states that she has been non-traumatic rib pain on the R side for the past 3 days. Pt states she is also 5 days late on her period. Pt is A&OX4 and NAD

## 2021-10-23 NOTE — ED Notes (Signed)
E-signature not working at this time. Pt verbalized understanding of D/C instructions, prescriptions and follow up care with no further questions at this time. Pt in NAD and ambulatory at time of D/C.  

## 2022-02-20 NOTE — Progress Notes (Unsigned)
GYNECOLOGY PROGRESS NOTE  Subjective:    Patient ID: Carolyn Dawson, female    DOB: [birth year], 32 y.o.   MRN: [id number]  HPI  Patient is a 32 y.o. [id number] female who presents as a referral from [facility] for evaluation of a right inguinal fold/vaginal lesion.  Patient reports that she had a skin tag removed from her right vulvar region ~ 1 month ago.  After this time, approximately 2 weeks later she noted another lesion in her right groin/vulvar area . She went for evaluation of the area and was told that it might be a cyst under her skin.  Cyst is irritating and tender at times and so patient requested referral. She said that she thinks it is getting bigger.  The following portions of the patient's history were reviewed and updated as appropriate:   She  has a past medical history of Anemia, Anxiety, Asthma, Back pain, Bipolar disorder ([place]), and Scoliosis.  She  has a past surgical history that includes Cesarean section; Septoplasty (N/A, 08/15/2021); and Closed reduction nasal fracture (N/A, 08/15/2021).  She  reports that she has been smoking cigarettes. She has never used smokeless tobacco. She reports current alcohol use of about 1.0 standard drink of alcohol per week. She reports that she does not currently use drugs after having used the following drugs: Marijuana.  Current Outpatient Medications on File Prior to Visit  Medication Sig Dispense Refill   albuterol (VENTOLIN HFA) 108 (90 Base) MCG/ACT inhaler Inhale 2 puffs into the lungs every 6 (six) hours as needed for wheezing or shortness of breath.     lidocaine (LIDODERM) 5 % Place 1 patch onto the skin every 12 (twelve) hours. Remove & Discard patch within 12 hours or as directed by MD 10 patch 0   Multiple Vitamins-Minerals (MULTIVITAMIN) tablet Take 1 tablet by mouth daily. 100 tablet 0   No current facility-administered medications on file prior to visit.   She has No Known Allergies..  Review of  Systems Pertinent items are noted in HPI.   Objective:   Blood pressure 112/70, pulse 78, resp. rate 16, height 5' 0.5" (1.537 m), weight 124 lb 1.6 oz (56.3 kg), last menstrual period 01/30/2022, currently breastfeeding. Body mass index is 23.84 kg/m. General appearance: alert, cooperative, and no distress Abdomen: soft, non-tender; bowel sounds normal; no masses,  no organomegaly Pelvic: external genitalia with palpable likely subcutaneous cyst vs small abscess, ~ 1 x 1.5 cm, with mild surrounding swelling on right vulva near inguinal fold. Internal exam not performed.     Assessment:   1. Vulvar mass      Plan:   Vulvar mass - attempted I&D, with scant drainage of slightly purulent fluid and blood. See procedure note below.  Advised on post-procedure management. RTC in 1 week if no improvement.     Diagnosis: abscess vs epidermal inclusion cyst - Location: Right Vulva/Inguinal fold Procedure: Incision & drainage Informed consent:  Discussed risks ( infection, pain, bleeding, bruising, and recurrence of the condition) and benefits of the procedure, as well as the alternatives.  Informed consent was obtained. Anesthesia: 3 ml of 1% lidocaine  The area was prepared and draped in a standard fashion. The area was incised with a scalpel. The lesion drained scant pus and blood. An interrupted suture of 3-0 Vicryl was used to loosely reapproximate the region. Antibiotic ointment and a sterile pressure dressing were applied.  The patient tolerated the procedure well.  The patient was instructed  on post-op care.    Rubie Maid, MD [place]

## 2022-02-21 ENCOUNTER — Ambulatory Visit (INDEPENDENT_AMBULATORY_CARE_PROVIDER_SITE_OTHER): Payer: Medicaid Other | Admitting: Obstetrics and Gynecology

## 2022-02-21 ENCOUNTER — Encounter: Payer: Self-pay | Admitting: Obstetrics and Gynecology

## 2022-02-21 VITALS — BP 112/70 | HR 78 | Resp 16 | Ht 60.5 in | Wt 124.1 lb

## 2022-02-21 DIAGNOSIS — N9089 Other specified noninflammatory disorders of vulva and perineum: Secondary | ICD-10-CM

## 2022-03-15 ENCOUNTER — Encounter: Payer: Medicaid Other | Admitting: Obstetrics and Gynecology

## 2022-09-06 ENCOUNTER — Telehealth: Payer: Self-pay

## 2022-09-06 NOTE — Telephone Encounter (Signed)
Patient reports she was seen at [facility] earlier today and had +positive pregnancy test. She states her LMP is 08/02/22 ([redacted]w[redacted]d). She tried to schedule appointment for abortion. They advised her she needs an ultrasound to confirm the pregnancy is in the uterus. She is requesting an order for ultrasound. Advised although she is a patient here, we did not confirm the pregnancy and are not able to see any notes from [facility] in [employer]. Also discussed that u/s not usually able to see needed details until patient is at least 6 weeks. Patient will contact [facility] and request documentation from today's visit be faxed to [place] and call us back to let us know if they will be able to send her records. Message will be sent to on call provider to review and advise if we are able to order u/s.

## 2022-09-11 ENCOUNTER — Ambulatory Visit
Admission: EM | Admit: 2022-09-11 | Discharge: 2022-09-11 | Disposition: A | Discharge status: Home / Self Care | Payer: Medicaid Other | Attending: Physician Assistant | Admitting: Physician Assistant

## 2022-09-11 DIAGNOSIS — M7918 Myalgia, other site: Secondary | ICD-10-CM

## 2022-09-11 DIAGNOSIS — R110 Nausea: Secondary | ICD-10-CM | POA: Diagnosis not present

## 2022-09-11 MED ORDER — ONDANSETRON 4 MG PO TBDP: 4.0000 mg | ORAL_TABLET | Freq: Three times a day (TID) | ORAL | 0 refills | Status: DC | PRN
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
## 2022-09-11 NOTE — Discharge Instructions (Signed)
-  Tylenol as needed for pain relief until you terminate the pregnancy (if that is still your choice ) and then you may take NSAIDs and muscle relaxers. heat and ice is fine at this time.  Consider physical therapy as well.  May contact results physiotherapy for appointment. -I sent nausea medicine.  Eat smaller meals and increase fluid intake.
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
## 2022-09-13 NOTE — Telephone Encounter (Signed)
Patient is scheduled 09/18/22 @10 :15 with Dr. Valentino Saxon.

## 2022-09-14 ENCOUNTER — Telehealth: Payer: Self-pay

## 2022-09-14 NOTE — Telephone Encounter (Signed)
Pt called triage reporting that she was expecting to get the abortion pill from [facility]. Pt aware we only provide prenatal care here. If she wanted an abortion she would have to look into another practice that provided those services.

## 2022-09-14 NOTE — Telephone Encounter (Signed)
Pt called triage to report she has had a pregnancy confirmation with another practice. She stated she has an appointment  with [place] on the 17th for a confirmation. Pt aware we need the records sent here or she will still need to do the Confirmation on the 17th

## 2022-09-18 ENCOUNTER — Ambulatory Visit: Payer: Medicaid Other | Admitting: Obstetrics and Gynecology

## 2022-09-26 ENCOUNTER — Ambulatory Visit: Payer: Self-pay | Admitting: *Deleted
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
## 2022-09-30 ENCOUNTER — Emergency Department: Payer: Medicaid Other

## 2022-09-30 ENCOUNTER — Other Ambulatory Visit: Payer: Self-pay

## 2022-09-30 ENCOUNTER — Emergency Department
Admission: EM | Admit: 2022-09-30 | Discharge: 2022-09-30 | Disposition: A | Discharge status: Home / Self Care | Payer: Medicaid Other | Attending: Emergency Medicine | Admitting: Emergency Medicine

## 2022-09-30 ENCOUNTER — Encounter: Payer: Self-pay | Admitting: Emergency Medicine

## 2022-09-30 DIAGNOSIS — O039 Complete or unspecified spontaneous abortion without complication: Secondary | ICD-10-CM | POA: Diagnosis not present

## 2022-09-30 DIAGNOSIS — J45909 Unspecified asthma, uncomplicated: Secondary | ICD-10-CM | POA: Diagnosis not present

## 2022-09-30 DIAGNOSIS — Z3A08 8 weeks gestation of pregnancy: Secondary | ICD-10-CM | POA: Insufficient documentation

## 2022-09-30 DIAGNOSIS — F17210 Nicotine dependence, cigarettes, uncomplicated: Secondary | ICD-10-CM | POA: Diagnosis not present

## 2022-09-30 DIAGNOSIS — O209 Hemorrhage in early pregnancy, unspecified: Secondary | ICD-10-CM | POA: Diagnosis present

## 2022-09-30 LAB — CBC
HCT: 35.5 % — ABNORMAL LOW (ref 36.0–46.0)
Hemoglobin: 11.4 g/dL — ABNORMAL LOW (ref 12.0–15.0)
MCH: 27.8 pg (ref 26.0–34.0)
MCHC: 32.1 g/dL (ref 30.0–36.0)
MCV: 86.6 fL (ref 80.0–100.0)
Platelets: 187 10*3/uL (ref 150–400)
RBC: 4.1 MIL/uL (ref 3.87–5.11)
RDW: 12.1 % (ref 11.5–15.5)
WBC: 6.7 10*3/uL (ref 4.0–10.5)
nRBC: 0 % (ref 0.0–0.2)

## 2022-09-30 LAB — ABO/RH: ABO/RH(D): A POS

## 2022-09-30 LAB — HCG, QUANTITATIVE, PREGNANCY: hCG, Beta Chain, Quant, S: 15256 m[IU]/mL — ABNORMAL HIGH (ref <5)
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
## 2022-09-30 NOTE — ED Notes (Addendum)
See triage note  Presents with some vaginal bleeding  States she is approx [redacted] weeks pregnant  States she noticed some bleeding 2 days ago   States the bleeding has been heavy and she has passed some clots   States she was seen by her PCP and told them she wanted to abort this child  States cramping is worse
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
## 2022-09-30 NOTE — Discharge Instructions (Addendum)
You have been seen today in the emergency room and been diagnosed as having a miscarriage.  You have what we discussed as retained products of conception, meaning that you have not completely expelled all products of the fetus.  I discussed with you that I wanted to contact OB for consultation.  You did not want to wait in the emergency room for this consult to happen.  Therefore you chose to go home.  I strongly encourage you to reach out to OB first thing in the morning to schedule an appointment to discuss how they would like to further manage this.  Please understand that you can Become very sick if these products of conception are not removed.

## 2022-10-01 ENCOUNTER — Encounter: Payer: Self-pay | Admitting: Obstetrics

## 2022-10-01 ENCOUNTER — Other Ambulatory Visit: Payer: Self-pay | Admitting: Obstetrics

## 2022-10-01 ENCOUNTER — Ambulatory Visit: Payer: Medicaid Other | Admitting: Obstetrics

## 2022-10-01 VITALS — BP 116/78 | HR 91 | Ht 61.0 in | Wt 120.4 lb

## 2022-10-01 DIAGNOSIS — O034 Incomplete spontaneous abortion without complication: Secondary | ICD-10-CM

## 2022-10-01 DIAGNOSIS — Z3A01 Less than 8 weeks gestation of pregnancy: Secondary | ICD-10-CM

## 2022-10-01 DIAGNOSIS — O039 Complete or unspecified spontaneous abortion without complication: Secondary | ICD-10-CM | POA: Insufficient documentation

## 2022-10-01 MED ORDER — IBUPROFEN 800 MG PO TABS: 800.0000 mg | ORAL_TABLET | Freq: Three times a day (TID) | ORAL | 0 refills | Status: AC | PRN

## 2022-10-01 MED ORDER — OXYCODONE-ACETAMINOPHEN 5-325 MG PO TABS: 1.0000 | ORAL_TABLET | Freq: Three times a day (TID) | ORAL | 0 refills | Status: AC | PRN

## 2022-10-01 MED ORDER — MISOPROSTOL 200 MCG PO TABS: 800.0000 ug | ORAL_TABLET | Freq: Four times a day (QID) | ORAL | 0 refills | Status: DC
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
## 2022-10-01 NOTE — Patient Instructions (Signed)
CYTOTEC (MISOPROSTOL) INSTRUCTIONS  Who should not take this medication? If you have an allergy to Cytotec  What are the side effects of the Cytotec? Menstrual cramping or contractions Vaginal bleeding-soaking a maxi pad within 1 hour is considered excessive bleeding and you should go to the emergency room for further evaluation Headache Abdominal pain Nausea or vomiting.  Small frequent meals, sucking on hard sugar-free candy, or chewing sugar-free gum may help.  Diarrhea  How do I take Cytotec? You have chosen medical management.  You will be given a prescription for Cytotec 800 mcg in four 200 mcg.  You will take 4 of the tabs orally.   Your pregnancy will begin to pass in about 8 to 12 hours after placement of the tablets.  Cramping, abdominal pain, and bleeding is common.  This is how the medication works.  You may take ibuprofen 800 mg every 8 hours for mild discomfort.  If a prescription Percocet was given, you may also take this medication every 8 hours for moderate to severe pain.   If you do not have significant bleeding within 6 hours after placement of Cytotec, you should repeat this process the next day.  If you pass the pregnancy, please make sure you have a follow-up appointment with our office in 1 week.  To ensure complete passage of the pregnancy with either an ultrasound or blood tests at this visit.  Expect bleeding for 1 to 3 weeks after the miscarriage passes, however this bleeding should be light.  If your blood type is Rh negative, please ensure you receive your RhoGam injection within 72 hours of bleeding  If your bleeding soaks a maxi pad completely within 1 hour, your bleeding to heavily, and should go to the emergency room for further evaluation.  Cytotec is 86% effective within the first 24 hours and 94% effective within 48 hours with a repeat dose.  If you have no significant bleeding within 48 hours, please call our office.

## 2022-10-02 ENCOUNTER — Telehealth: Payer: Self-pay

## 2022-10-02 NOTE — Telephone Encounter (Signed)
Telephone Advice Record received via fax from [employer]. Caller Chief Complaint: Caller says that she wants to get a doctor's note that she can be out of work when she takes the pills.
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
## 2022-10-04 ENCOUNTER — Ambulatory Visit: Payer: Medicaid Other | Attending: Family Medicine | Admitting: Physical Therapy

## 2022-10-04 ENCOUNTER — Encounter: Payer: Self-pay | Admitting: Physical Therapy

## 2022-10-04 DIAGNOSIS — M6281 Muscle weakness (generalized): Secondary | ICD-10-CM | POA: Diagnosis present

## 2022-10-04 DIAGNOSIS — M5459 Other low back pain: Secondary | ICD-10-CM | POA: Insufficient documentation

## 2022-10-04 DIAGNOSIS — R262 Difficulty in walking, not elsewhere classified: Secondary | ICD-10-CM | POA: Diagnosis present
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
## 2022-10-08 ENCOUNTER — Other Ambulatory Visit: Payer: Medicaid Other

## 2022-10-08 ENCOUNTER — Telehealth: Payer: Self-pay | Admitting: Obstetrics

## 2022-10-08 ENCOUNTER — Encounter: Payer: Self-pay | Admitting: Physical Therapy

## 2022-10-08 NOTE — Telephone Encounter (Signed)
Reached out to pt to reschedule lab appt that was scheduled on 10/08/2022 at 8:40 (per Dr. Lonny Prude).  Could not leave message bc mailbox was not set up.

## 2022-10-09 ENCOUNTER — Ambulatory Visit: Payer: Medicaid Other | Admitting: Physical Therapy

## 2022-10-09 ENCOUNTER — Encounter: Payer: Self-pay | Admitting: Obstetrics

## 2022-10-09 DIAGNOSIS — M6281 Muscle weakness (generalized): Secondary | ICD-10-CM

## 2022-10-09 DIAGNOSIS — M5459 Other low back pain: Secondary | ICD-10-CM

## 2022-10-09 DIAGNOSIS — R262 Difficulty in walking, not elsewhere classified: Secondary | ICD-10-CM

## 2022-10-09 NOTE — Telephone Encounter (Addendum)
I contacted the patient via phone. No answer. Voicemail is not set up, unable to leave message. Missed appointment my chart letter sent
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
## 2022-10-11 ENCOUNTER — Encounter: Payer: Self-pay | Admitting: Physical Therapy

## 2022-10-11 ENCOUNTER — Ambulatory Visit: Payer: Medicaid Other | Admitting: Physical Therapy

## 2022-10-11 DIAGNOSIS — R262 Difficulty in walking, not elsewhere classified: Secondary | ICD-10-CM

## 2022-10-11 DIAGNOSIS — M5459 Other low back pain: Secondary | ICD-10-CM | POA: Diagnosis not present

## 2022-10-11 DIAGNOSIS — M6281 Muscle weakness (generalized): Secondary | ICD-10-CM
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
## 2022-10-12 ENCOUNTER — Other Ambulatory Visit: Payer: Medicaid Other

## 2022-10-12 ENCOUNTER — Telehealth: Payer: Self-pay

## 2022-10-12 DIAGNOSIS — O034 Incomplete spontaneous abortion without complication: Secondary | ICD-10-CM

## 2022-10-12 NOTE — Telephone Encounter (Signed)
Pt would like to ask Dr. Lonny Prude how long after taking cytotec (took it this past weekend) should she completely finish passing her pregnancy. She has been clotting and passing tissue. She still saw little tissue this morning.

## 2022-10-12 NOTE — Telephone Encounter (Signed)
U/S ordered and pt aware. She also wants to know if you can prescribe her another round of pain meds. She has pretty much finished what was prescribed and since finishing them she has felt intense pain at times.

## 2022-10-12 NOTE — Telephone Encounter (Signed)
Pt aware, advised ibuprofen. Centralized scheduling # given.

## 2022-10-13 LAB — BETA HCG QUANT (REF LAB): hCG Quant: 1472 m[IU]/mL

## 2022-10-15 ENCOUNTER — Ambulatory Visit
Admission: RE | Admit: 2022-10-15 | Discharge: 2022-10-15 | Disposition: A | Discharge status: Home / Self Care | Payer: Medicaid Other | Source: Ambulatory Visit | Attending: Obstetrics | Admitting: Obstetrics

## 2022-10-15 DIAGNOSIS — O034 Incomplete spontaneous abortion without complication: Secondary | ICD-10-CM | POA: Diagnosis present

## 2022-10-16 ENCOUNTER — Ambulatory Visit: Payer: Medicaid Other | Admitting: Physical Therapy

## 2022-10-16 DIAGNOSIS — M5459 Other low back pain: Secondary | ICD-10-CM

## 2022-10-16 DIAGNOSIS — R262 Difficulty in walking, not elsewhere classified: Secondary | ICD-10-CM

## 2022-10-16 DIAGNOSIS — M6281 Muscle weakness (generalized): Secondary | ICD-10-CM
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
## 2022-10-18 ENCOUNTER — Other Ambulatory Visit: Payer: Self-pay

## 2022-10-18 ENCOUNTER — Encounter: Payer: Self-pay | Admitting: Physical Therapy

## 2022-10-18 ENCOUNTER — Ambulatory Visit: Payer: Medicaid Other | Admitting: Physical Therapy

## 2022-10-18 DIAGNOSIS — Z5321 Procedure and treatment not carried out due to patient leaving prior to being seen by health care provider: Secondary | ICD-10-CM | POA: Insufficient documentation

## 2022-10-18 DIAGNOSIS — N939 Abnormal uterine and vaginal bleeding, unspecified: Secondary | ICD-10-CM | POA: Insufficient documentation

## 2022-10-18 LAB — CBC
HCT: 32 % — ABNORMAL LOW (ref 36.0–46.0)
Hemoglobin: 10.3 g/dL — ABNORMAL LOW (ref 12.0–15.0)
MCH: 27.8 pg (ref 26.0–34.0)
MCHC: 32.2 g/dL (ref 30.0–36.0)
MCV: 86.3 fL (ref 80.0–100.0)
Platelets: 229 10*3/uL (ref 150–400)
RBC: 3.71 MIL/uL — ABNORMAL LOW (ref 3.87–5.11)
RDW: 12.1 % (ref 11.5–15.5)
WBC: 7.2 10*3/uL (ref 4.0–10.5)
nRBC: 0 % (ref 0.0–0.2)

## 2022-10-18 LAB — HCG, QUANTITATIVE, PREGNANCY: hCG, Beta Chain, Quant, S: 421 m[IU]/mL — ABNORMAL HIGH (ref <5)

## 2022-10-18 NOTE — ED Triage Notes (Signed)
Pt reports miscarriage on 9/29. Reports was seen at [facility] and given medication to complete the process. Reports increase in vaginal bleeding and lower abd cramping today. Pt ambulatory to triage. Alert and oriented on arrival. Breathing unlabored speaking in full sentences.

## 2022-10-19 ENCOUNTER — Telehealth: Payer: Self-pay | Admitting: Obstetrics

## 2022-10-19 ENCOUNTER — Emergency Department
Admission: EM | Admit: 2022-10-19 | Discharge: 2022-10-19 | Discharge status: Against Medical Advice | Payer: Medicaid Other | Attending: Emergency Medicine | Admitting: Emergency Medicine

## 2022-10-19 ENCOUNTER — Other Ambulatory Visit: Payer: Self-pay | Admitting: Obstetrics

## 2022-10-19 DIAGNOSIS — O034 Incomplete spontaneous abortion without complication: Secondary | ICD-10-CM

## 2022-10-19 MED ORDER — OXYCODONE-ACETAMINOPHEN 5-325 MG PO TABS: 1.0000 | ORAL_TABLET | Freq: Four times a day (QID) | ORAL | 0 refills | Status: AC | PRN

## 2022-10-19 MED ORDER — MISOPROSTOL 200 MCG PO TABS: 800.0000 ug | ORAL_TABLET | Freq: Four times a day (QID) | ORAL | 0 refills | Status: DC

## 2022-10-19 MED ORDER — IBUPROFEN 800 MG PO TABS: 800.0000 mg | ORAL_TABLET | Freq: Three times a day (TID) | ORAL | 0 refills | Status: AC | PRN

## 2022-10-19 NOTE — ED Notes (Signed)
No answer when called several times from lobby 

## 2022-10-19 NOTE — Telephone Encounter (Signed)
Returned call, no answer and voice mailbox full

## 2022-10-19 NOTE — Telephone Encounter (Signed)
The is returning missed call . Please advise?

## 2022-10-20 NOTE — Plan of Care (Signed)
[facility] Tonsillectomy/Adenoidectomy, Postoperative PEDS care plan entered in error.

## 2022-10-23 ENCOUNTER — Ambulatory Visit: Payer: Medicaid Other | Admitting: Physical Therapy

## 2022-10-24 NOTE — Progress Notes (Signed)
Pt states she will get the medication today, she's still bleeding but also feel exhausted and weak. Please advise

## 2022-10-25 ENCOUNTER — Encounter: Payer: Medicaid Other | Admitting: Physical Therapy

## 2022-10-29 ENCOUNTER — Other Ambulatory Visit: Payer: Self-pay | Admitting: Obstetrics

## 2022-10-29 ENCOUNTER — Other Ambulatory Visit: Payer: Self-pay

## 2022-10-29 DIAGNOSIS — O034 Incomplete spontaneous abortion without complication: Secondary | ICD-10-CM

## 2022-10-29 DIAGNOSIS — N939 Abnormal uterine and vaginal bleeding, unspecified: Secondary | ICD-10-CM

## 2022-10-29 NOTE — Progress Notes (Signed)
Do I order it with or without contrast?

## 2022-10-29 NOTE — Progress Notes (Signed)
I got in touch with her today, she stated that she is still bleeding and will call us when its stopped so she can schedule the u/s

## 2022-10-30 ENCOUNTER — Encounter: Payer: Medicaid Other | Admitting: Physical Therapy

## 2022-10-30 ENCOUNTER — Telehealth: Payer: Self-pay

## 2022-10-30 NOTE — Progress Notes (Signed)
Do you schedule these?

## 2022-10-30 NOTE — Telephone Encounter (Signed)
Pt calling; feels week and feels like she could faint; has been bleeding for seven weeks now; she can't walk or get herself together; u/s showed her uterus isn't completely empty; having miscarriage.  Please call.  [phone]   VM isn't set up.

## 2022-10-30 NOTE — Telephone Encounter (Signed)
PC to pt; adv she needs to be seen; to go to ER as they can give her fluids whereas we cannot. Pt voices understanding.  Pt to have someone drive her.

## 2022-10-31 NOTE — Progress Notes (Signed)
Carolyn Dawson is scheduled for her MRI tomorrow at 4:30pm

## 2022-11-01 ENCOUNTER — Ambulatory Visit: Admission: RE | Admit: 2022-11-01 | Payer: Medicaid Other | Source: Ambulatory Visit

## 2022-11-01 ENCOUNTER — Encounter: Payer: Medicaid Other | Admitting: Physical Therapy

## 2022-11-02 ENCOUNTER — Ambulatory Visit: Admission: RE | Admit: 2022-11-02 | Payer: Medicaid Other | Source: Ambulatory Visit

## 2022-11-02 ENCOUNTER — Ambulatory Visit
Admission: RE | Admit: 2022-11-02 | Discharge: 2022-11-02 | Disposition: A | Discharge status: Home / Self Care | Payer: Medicaid Other | Source: Ambulatory Visit | Attending: Obstetrics | Admitting: Obstetrics

## 2022-11-02 DIAGNOSIS — N939 Abnormal uterine and vaginal bleeding, unspecified: Secondary | ICD-10-CM | POA: Insufficient documentation

## 2022-11-02 DIAGNOSIS — O034 Incomplete spontaneous abortion without complication: Secondary | ICD-10-CM | POA: Diagnosis present

## 2022-11-02 MED ORDER — GADOBUTROL 1 MMOL/ML IV SOLN
5.0000 mL | Freq: Once | INTRAVENOUS | Status: AC | PRN
  Administered 2022-11-02: 5 mL via INTRAVENOUS

## 2022-11-03 ENCOUNTER — Ambulatory Visit: Payer: Medicaid Other

## 2022-11-06 ENCOUNTER — Encounter: Payer: Medicaid Other | Admitting: Physical Therapy

## 2022-11-08 ENCOUNTER — Encounter: Payer: Medicaid Other | Admitting: Physical Therapy

## 2022-11-08 ENCOUNTER — Ambulatory Visit: Payer: Medicaid Other | Admitting: Physical Therapy
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
## 2022-11-09 ENCOUNTER — Other Ambulatory Visit (INDEPENDENT_AMBULATORY_CARE_PROVIDER_SITE_OTHER): Payer: Medicaid Other

## 2022-11-09 ENCOUNTER — Other Ambulatory Visit: Payer: Self-pay

## 2022-11-09 DIAGNOSIS — O034 Incomplete spontaneous abortion without complication: Secondary | ICD-10-CM | POA: Diagnosis not present

## 2022-11-09 DIAGNOSIS — Z3A01 Less than 8 weeks gestation of pregnancy: Secondary | ICD-10-CM | POA: Diagnosis not present

## 2022-11-09 DIAGNOSIS — O200 Threatened abortion: Secondary | ICD-10-CM

## 2022-11-09 LAB — POCT HEMOGLOBIN: Hemoglobin: 10.5 g/dL — AB (ref 11–14.6)

## 2022-11-10 LAB — CBC
Hematocrit: 33.1 % — ABNORMAL LOW (ref 34.0–46.6)
Hemoglobin: 10.2 g/dL — ABNORMAL LOW (ref 11.1–15.9)
MCH: 27.9 pg (ref 26.6–33.0)
MCHC: 30.8 g/dL — ABNORMAL LOW (ref 31.5–35.7)
MCV: 91 fL (ref 79–97)
Platelets: 171 10*3/uL (ref 150–450)
RBC: 3.65 x10E6/uL — ABNORMAL LOW (ref 3.77–5.28)
RDW: 12.2 % (ref 11.7–15.4)
WBC: 4.8 10*3/uL (ref 3.4–10.8)

## 2022-11-10 LAB — BETA HCG QUANT (REF LAB): hCG Quant: 35 m[IU]/mL

## 2022-11-12 ENCOUNTER — Encounter: Payer: Medicaid Other | Admitting: Physical Therapy

## 2022-11-13 ENCOUNTER — Encounter: Payer: Medicaid Other | Admitting: Physical Therapy

## 2022-11-13 ENCOUNTER — Ambulatory Visit: Payer: Medicaid Other | Attending: Family Medicine | Admitting: Physical Therapy

## 2022-11-13 ENCOUNTER — Encounter: Payer: Self-pay | Admitting: Physical Therapy

## 2022-11-13 DIAGNOSIS — M5459 Other low back pain: Secondary | ICD-10-CM | POA: Insufficient documentation

## 2022-11-13 DIAGNOSIS — M6281 Muscle weakness (generalized): Secondary | ICD-10-CM | POA: Diagnosis present

## 2022-11-13 DIAGNOSIS — R262 Difficulty in walking, not elsewhere classified: Secondary | ICD-10-CM | POA: Diagnosis present
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
## 2022-11-14 ENCOUNTER — Other Ambulatory Visit: Payer: Self-pay | Admitting: Obstetrics

## 2022-11-14 ENCOUNTER — Encounter: Payer: Medicaid Other | Admitting: Physical Therapy

## 2022-11-14 ENCOUNTER — Other Ambulatory Visit: Payer: Self-pay

## 2022-11-14 DIAGNOSIS — D509 Iron deficiency anemia, unspecified: Secondary | ICD-10-CM

## 2022-11-14 DIAGNOSIS — O034 Incomplete spontaneous abortion without complication: Secondary | ICD-10-CM

## 2022-11-14 MED ORDER — DOCUSATE SODIUM 100 MG PO CAPS: 100.0000 mg | ORAL_CAPSULE | Freq: Every day | ORAL | 0 refills | Status: DC

## 2022-11-14 MED ORDER — FERROUS FUMARATE 150 MG PO TABS: 1.0000 | ORAL_TABLET | Freq: Every day | ORAL | 0 refills | Status: DC

## 2022-11-14 NOTE — Progress Notes (Signed)
Pt aware of results, What can pt take to help with Iron, she feels really Faint. She throws up the prenatals, She's Drinking a Boost trying to help. Would a iron pill help? She wants to know if you can order a iron transfusion?

## 2022-11-14 NOTE — Progress Notes (Signed)
Pt aware.

## 2022-11-15 ENCOUNTER — Encounter: Payer: Medicaid Other | Admitting: Physical Therapy

## 2022-11-15 ENCOUNTER — Ambulatory Visit: Payer: Medicaid Other

## 2022-11-15 DIAGNOSIS — M5459 Other low back pain: Secondary | ICD-10-CM | POA: Diagnosis not present

## 2022-11-15 DIAGNOSIS — M6281 Muscle weakness (generalized): Secondary | ICD-10-CM

## 2022-11-15 DIAGNOSIS — R262 Difficulty in walking, not elsewhere classified: Secondary | ICD-10-CM
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
## 2022-11-19 ENCOUNTER — Encounter: Payer: Medicaid Other | Admitting: Physical Therapy

## 2022-11-20 ENCOUNTER — Encounter: Payer: Self-pay | Admitting: Physical Therapy

## 2022-11-20 ENCOUNTER — Ambulatory Visit: Payer: Medicaid Other | Admitting: Physical Therapy

## 2022-11-20 DIAGNOSIS — R262 Difficulty in walking, not elsewhere classified: Secondary | ICD-10-CM

## 2022-11-20 DIAGNOSIS — M6281 Muscle weakness (generalized): Secondary | ICD-10-CM

## 2022-11-20 DIAGNOSIS — M5459 Other low back pain: Secondary | ICD-10-CM | POA: Diagnosis not present
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
## 2022-11-21 ENCOUNTER — Encounter: Payer: Medicaid Other | Admitting: Physical Therapy

## 2022-11-22 ENCOUNTER — Ambulatory Visit: Payer: Medicaid Other | Admitting: Physical Therapy

## 2022-11-22 ENCOUNTER — Encounter: Payer: Self-pay | Admitting: Physical Therapy

## 2022-11-22 DIAGNOSIS — R262 Difficulty in walking, not elsewhere classified: Secondary | ICD-10-CM

## 2022-11-22 DIAGNOSIS — M6281 Muscle weakness (generalized): Secondary | ICD-10-CM

## 2022-11-22 DIAGNOSIS — M5459 Other low back pain: Secondary | ICD-10-CM | POA: Diagnosis not present
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
## 2022-11-26 ENCOUNTER — Encounter: Payer: Medicaid Other | Admitting: Physical Therapy

## 2022-11-27 ENCOUNTER — Ambulatory Visit: Payer: Medicaid Other | Admitting: Physical Therapy
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
## 2022-11-28 ENCOUNTER — Encounter: Payer: Medicaid Other | Admitting: Physical Therapy

## 2022-12-03 ENCOUNTER — Encounter: Payer: Medicaid Other | Admitting: Physical Therapy
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
## 2022-12-04 ENCOUNTER — Ambulatory Visit: Payer: Medicaid Other | Attending: Family Medicine | Admitting: Physical Therapy

## 2022-12-04 DIAGNOSIS — R262 Difficulty in walking, not elsewhere classified: Secondary | ICD-10-CM | POA: Insufficient documentation

## 2022-12-04 DIAGNOSIS — M6281 Muscle weakness (generalized): Secondary | ICD-10-CM | POA: Insufficient documentation

## 2022-12-04 DIAGNOSIS — M5459 Other low back pain: Secondary | ICD-10-CM | POA: Insufficient documentation

## 2022-12-05 ENCOUNTER — Encounter: Payer: Medicaid Other | Admitting: Physical Therapy

## 2022-12-06 ENCOUNTER — Ambulatory Visit: Payer: Medicaid Other | Admitting: Physical Therapy

## 2022-12-06 ENCOUNTER — Encounter: Payer: Self-pay | Admitting: Physical Therapy

## 2022-12-06 DIAGNOSIS — R262 Difficulty in walking, not elsewhere classified: Secondary | ICD-10-CM

## 2022-12-06 DIAGNOSIS — M5459 Other low back pain: Secondary | ICD-10-CM | POA: Diagnosis present

## 2022-12-06 DIAGNOSIS — M6281 Muscle weakness (generalized): Secondary | ICD-10-CM | POA: Diagnosis present
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
## 2022-12-10 ENCOUNTER — Encounter: Payer: Medicaid Other | Admitting: Physical Therapy

## 2022-12-11 ENCOUNTER — Ambulatory Visit: Payer: Medicaid Other | Admitting: Physical Therapy

## 2022-12-11 DIAGNOSIS — M5459 Other low back pain: Secondary | ICD-10-CM

## 2022-12-11 DIAGNOSIS — M6281 Muscle weakness (generalized): Secondary | ICD-10-CM

## 2022-12-11 DIAGNOSIS — R262 Difficulty in walking, not elsewhere classified: Secondary | ICD-10-CM
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
## 2022-12-12 ENCOUNTER — Encounter: Payer: Medicaid Other | Admitting: Physical Therapy

## 2022-12-18 ENCOUNTER — Encounter: Payer: Medicaid Other | Admitting: Physical Therapy
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
## 2022-12-20 ENCOUNTER — Ambulatory Visit: Payer: Medicaid Other | Admitting: Physical Therapy

## 2022-12-20 ENCOUNTER — Encounter: Payer: Medicaid Other | Admitting: Physical Therapy

## 2022-12-20 DIAGNOSIS — M5459 Other low back pain: Secondary | ICD-10-CM

## 2022-12-20 DIAGNOSIS — R262 Difficulty in walking, not elsewhere classified: Secondary | ICD-10-CM

## 2022-12-20 DIAGNOSIS — M6281 Muscle weakness (generalized): Secondary | ICD-10-CM
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
## 2022-12-25 ENCOUNTER — Encounter: Payer: Medicaid Other | Admitting: Physical Therapy

## 2022-12-27 ENCOUNTER — Encounter: Payer: Self-pay | Admitting: Physical Therapy

## 2022-12-27 ENCOUNTER — Encounter: Payer: Medicaid Other | Admitting: Physical Therapy

## 2022-12-27 ENCOUNTER — Ambulatory Visit: Payer: Medicaid Other | Admitting: Physical Therapy

## 2022-12-27 DIAGNOSIS — M6281 Muscle weakness (generalized): Secondary | ICD-10-CM

## 2022-12-27 DIAGNOSIS — M5459 Other low back pain: Secondary | ICD-10-CM | POA: Diagnosis not present

## 2022-12-27 DIAGNOSIS — R262 Difficulty in walking, not elsewhere classified: Secondary | ICD-10-CM
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
## 2022-12-31 ENCOUNTER — Telehealth: Payer: Self-pay

## 2022-12-31 NOTE — Telephone Encounter (Signed)
Patient states having abnormal bleeding starting on Date of onset: unknown , "has been bleeding for a few days". Patient states the bleeding is moderate and dark.  Patient states mild abdominal pain and cramping. She states  has not  started new birth control within the last 3 months.  has not had any recent surgeries.   UTI symptoms? dysuria, urinary frequency, and urinary urgency    PLAN: -If soaking a pad every 30 minutes to an hour or soiling clothes; appt if provider is in office or go to ED **notify provider -patient has had recent surgery- notify provider - bleeding lasting longer than 7 days or spotting between cycles- need appt   Scheduled to come in on 01/01/23 to follow-up with Dr. Lonny Prude for bleeding and possible UTI.
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
## 2023-01-01 ENCOUNTER — Ambulatory Visit: Payer: Medicaid Other | Admitting: Physical Therapy

## 2023-01-01 ENCOUNTER — Ambulatory Visit: Payer: Medicaid Other | Admitting: Obstetrics

## 2023-01-01 ENCOUNTER — Ambulatory Visit (INDEPENDENT_AMBULATORY_CARE_PROVIDER_SITE_OTHER): Payer: Medicaid Other

## 2023-01-01 ENCOUNTER — Other Ambulatory Visit (HOSPITAL_COMMUNITY)
Admission: RE | Admit: 2023-01-01 | Discharge: 2023-01-01 | Disposition: A | Discharge status: Home / Self Care | Payer: Medicaid Other | Source: Ambulatory Visit | Attending: Obstetrics | Admitting: Obstetrics

## 2023-01-01 VITALS — BP 98/62 | Ht 62.0 in | Wt 123.0 lb

## 2023-01-01 DIAGNOSIS — Z113 Encounter for screening for infections with a predominantly sexual mode of transmission: Secondary | ICD-10-CM | POA: Insufficient documentation

## 2023-01-01 DIAGNOSIS — Z3202 Encounter for pregnancy test, result negative: Secondary | ICD-10-CM

## 2023-01-01 DIAGNOSIS — R399 Unspecified symptoms and signs involving the genitourinary system: Secondary | ICD-10-CM

## 2023-01-01 LAB — POCT URINE PREGNANCY: Preg Test, Ur: NEGATIVE

## 2023-01-01 NOTE — Progress Notes (Signed)
    NURSE VISIT NOTE  Subjective:    Patient ID: Carolyn Dawson, female    DOB: August 22, 1990, 32 y.o.   MRN: [id number]       HPI  Patient is a 32 y.o. [phone] female who presents for UTI symptoms and a self swab for STD testing/yeast/bv. Has frequency and burning urinating, some cramping for the last 2-3 weeks. Pt would like UPT and std testing.  Objective:    BP 98/62   Ht 5' 2 (1.575 m)   Wt 123 lb (55.8 kg)   Breastfeeding No   BMI 22.50 kg/m    Lab Review  Results for orders placed or performed in visit on 01/01/23  POCT urine pregnancy  Result Value Ref Range   Preg Test, Ur Negative Negative    Assessment:   1. UTI symptoms   2. Screening for STD (sexually transmitted disease)      Plan:   Aptima and Urine Culture Sent. Maintain adequate hydration.  May use AZO OTC prn.  Follow up if symptoms worsen or fail to improve as anticipated, and as needed.    Beola Skeens, CMA

## 2023-01-01 NOTE — Patient Instructions (Signed)
 Bacterial Vaginosis  Bacterial vaginosis is an infection of the vagina. It happens when too many normal germs (healthy bacteria) grow in the vagina. This infection can make it easier to get other infections from sex (STIs). It is very important for pregnant women to get treated. This infection can cause babies to be born early or at a low birth weight. What are the causes? This infection is caused by an increase in certain germs that grow in the vagina. You cannot get this infection from toilet seats, bedsheets, swimming pools, or things that touch your vagina. What increases the risk? Having sex with a new person or more than one person. Having sex without protection. Douching. Having an intrauterine device (IUD). Smoking. Using drugs or drinking alcohol. These can lead you to do things that are risky. Taking certain antibiotic medicines. Being pregnant. What are the signs or symptoms? Some women have no symptoms. Symptoms may include: A discharge from your vagina. It may be gray or white. It can be watery or foamy. A fishy smell. This can happen after sex or during your menstrual period. Itching in and around your vagina. A feeling of burning or pain when you pee (urinate). How is this treated? This infection is treated with antibiotic medicines. These may be given to you as: A pill. A cream for your vagina. A medicine that you put into your vagina (suppository). If the infection comes back after treatment, you may need more antibiotics. Follow these instructions at home: Medicines Take over-the-counter and prescription medicines as told by your doctor. Take or use your antibiotic medicine as told by your doctor. Do not stop taking or using it, even if you start to feel better. General instructions If the person you have sex with is a woman, tell her that you have this infection. She will need to follow up with her doctor. If you have a female partner, he does not need to be  treated. Do not have sex until you finish treatment. Drink enough fluid to keep your pee pale yellow. Keep your vagina and butt clean. Wash the area with warm water each day. Wipe from front to back after you use the toilet. If you are breastfeeding a baby, ask your doctor if you should keep doing so during treatment. Keep all follow-up visits. How is this prevented? Self-care Do not douche. Use only warm water to wash around your vagina. Wear underwear that is cotton or lined with cotton. Do not wear tight pants and pantyhose, especially in the summer. Safe sex Use protection when you have sex. This includes: Use condoms. Use dental dams. This is a thin layer that protects the mouth during oral sex. Limit how many people you have sex with. To prevent this infection, it is best to have sex with just one person. Get tested for STIs. The person you have sex with should also get tested. Drugs and alcohol Do not smoke or use any products that contain nicotine or tobacco. If you need help quitting, ask your doctor. Do not use drugs. Do not drink alcohol if: Your doctor tells you not to drink. You are pregnant, may be pregnant, or are planning to become pregnant. If you drink alcohol: Limit how much you have to 0-1 drink a day. Know how much alcohol is in your drink. In the [place] one drink equals one 12 oz bottle of beer (355 mL), one 5 oz glass of wine (148 mL), or one 1 oz glass of hard liquor (44  mL). Where to find more information Centers for Disease Control and Prevention: footballexhibition.com.br American Sexual Health Association: www.ashastd.org Office on [employer] Health: http://hoffman.com/ Contact a doctor if: Your symptoms do not get better, even after you are treated. You have more discharge or pain when you pee. You have a fever or chills. You have pain in your belly (abdomen) or in the area between your hips. You have pain with sex. You bleed from your vagina between menstrual  periods. Summary This infection can happen when too many germs (bacteria) grow in the vagina. This infection can make it easier to get infections from sex (STIs). Treating this can lower that chance. Get treated if you are pregnant. This infection can cause babies to be born early. Do not stop taking or using your antibiotic medicine, even if you start to feel better. This information is not intended to replace advice given to you by your health care provider. Make sure you discuss any questions you have with your health care provider. Document Revised: 06/18/2019 Document Reviewed: 06/18/2019 Elsevier Patient Education  2024 [employer] Inc. Vaginal Yeast Infection, Adult  Vaginal yeast infection is a condition that causes vaginal discharge as well as soreness, swelling, and redness (inflammation) of the vagina. This is a common condition. Some women get this infection frequently. What are the causes? This condition is caused by a change in the normal balance of the yeast (Candida) and normal bacteria that live in the vagina. This change causes an overgrowth of yeast, which causes the inflammation. What increases the risk? The condition is more likely to develop in women who: Take antibiotic medicines. Have diabetes. Take birth control pills. Are pregnant. Douche often. Have a weak body defense system (immune system). Have been taking steroid medicines for a long time. Frequently wear tight clothing. What are the signs or symptoms? Symptoms of this condition include: White, thick, creamy vaginal discharge. Swelling, itching, redness, and irritation of the vagina. The lips of the vagina (labia) may be affected as well. Pain or a burning feeling while urinating. Pain during sex. How is this diagnosed? This condition is diagnosed based on: Your medical history. A physical exam. A pelvic exam. Your health care provider will examine a sample of your vaginal discharge under a microscope.  Your health care provider may send this sample for testing to confirm the diagnosis. How is this treated? This condition is treated with medicine. Medicines may be over-the-counter or prescription. You may be told to use one or more of the following: Medicine that is taken by mouth (orally). Medicine that is applied as a cream (topically). Medicine that is inserted directly into the vagina (suppository). Follow these instructions at home: Take or apply over-the-counter and prescription medicines only as told by your health care provider. Do not use tampons until your health care provider approves. Do not have sex until your infection has cleared. Sex can prolong or worsen your symptoms of infection. Ask your health care provider when it is safe to resume sexual activity. Keep all follow-up visits. This is important. How is this prevented?  Do not wear tight clothes, such as pantyhose or tight pants. Wear breathable cotton underwear. Do not use douches, perfumed soap, creams, or powders. Wipe from front to back after using the toilet. If you have diabetes, keep your blood sugar levels under control. Ask your health care provider for other ways to prevent yeast infections. Contact a health care provider if: You have a fever. Your symptoms go away and then  return. Your symptoms do not get better with treatment. Your symptoms get worse. You have new symptoms. You develop blisters in or around your vagina. You have blood coming from your vagina and it is not your menstrual period. You develop pain in your abdomen. Summary Vaginal yeast infection is a condition that causes discharge as well as soreness, swelling, and redness (inflammation) of the vagina. This condition is treated with medicine. Medicines may be over-the-counter or prescription. Take or apply over-the-counter and prescription medicines only as told by your health care provider. Do not douche. Resume sexual activity or use of  tampons as instructed by your health care provider. Contact a health care provider if your symptoms do not get better with treatment or your symptoms go away and then return. This information is not intended to replace advice given to you by your health care provider. Make sure you discuss any questions you have with your health care provider. Document Revised: 03/07/2020 Document Reviewed: 03/07/2020 Elsevier Patient Education  2024 [employer] Inc. Gonorrhea Gonorrhea is a sexually transmitted infection (STI) that can infect any person. If left untreated, this infection can: Damage the reproductive organs. Spread to other parts of the body. Cause someone to be unable to have children (infertility). Harm an unborn baby if an infected person is pregnant. It is important to get treatment for gonorrhea as soon as possible. All of your sex partners may also need to be treated for the infection. What are the causes? This condition is caused by bacteria called Neisseria gonorrhoeae. The infection is spread from person to person through sexual contact, including oral, anal, and vaginal sex. The infection can also pass from a pregnant person to the baby during birth. What increases the risk? The following factors may make you more likely to develop this condition: Being a woman younger than 25 years and sexually active. Being a man who has sex with men. Having a new sex partner or having multiple partners. Having a sex partner who has an STI. Not using condoms correctly or not using condoms every time you have sex. Having a history of STIs. What are the signs or symptoms? When symptoms occur, they may include: Abnormal discharge from the penis or vagina. The discharge may be cloudy, thick, or yellow-green in color. Pain or burning when you urinate. Itching, irritation, pain, bleeding, or discharge from the rectum. This may occur if the infection was spread by anal sex. Sore throat or swollen lymph  nodes in the neck. This may occur if the infection was spread by oral sex. Pain or swelling in the testicles. Bleeding between menstrual periods. If the infection has spread to other areas of the body, symptoms may include: Fever. Eye irritation. Swelling, redness, warmth, and pain in the joints. Rashes. In some cases, there are no symptoms. How is this diagnosed? This condition is diagnosed based on: A physical exam. A swab of fluid. The swab of fluid may be taken from the penis, vagina, throat, or rectum. Urine tests. Not all test results will be available during your visit. How is this treated? This condition is treated with antibiotic medicines. It is important to start treatment as soon as possible. Early treatment may prevent some problems from developing. You should not have sex during treatment. All types of sexual activity should be avoided for at least 7 days after treatment is complete and until your sex partner or partners have been treated. Follow these instructions at home: Take over-the-counter and prescription medicines as told  by your health care provider. Finish all antibiotic medicine even when you start to feel better. Do not have sex during treatment. Do not have sex until at least 7 days after you and your partner or partners have finished treatment and your health care provider says it is okay. It is up to you to get your test results. Ask your health care provider, or the department that is doing the test, when your results will be ready. If you get a positive result on your gonorrhea test, tell your recent sex partners. These include any partners for oral, anal, or vaginal sex. They need to be checked for gonorrhea even if they do not have symptoms. They may need treatment, even if they get negative results on their gonorrhea tests. Keep all follow-up visits. This is important. How is this prevented?  Use latex or polyurethane condoms correctly every time you have  sex. Ask if your sex partner or partners have been tested for STIs and had negative results. Avoid having multiple sex partners. Get regular health screenings to check for STIs. Contact a health care provider if: Your symptoms do not get better after a few days of taking antibiotics. Your symptoms get worse. You cannot take your medicine as directed by your health care provider. You develop new symptoms, including: Eye irritation. Swelling, redness, warmth, and pain in the joints. Rashes. You have a fever. Summary Gonorrhea is a sexually transmitted infection (STI) that can infect any person. This infection is spread from person to person through sexual contact, including oral, anal, and vaginal sex. The infection can also pass from a pregnant person to the baby during birth. Symptoms include abnormal discharge, pain or burning while urinating, or pain in the rectum. This condition is treated with antibiotic medicines. Do not have sex until at least 7 days after both you and any sex partners have completed antibiotic treatment. Tell your health care provider if you have trouble taking your medicine, your symptoms get worse, or you have new symptoms. Keep all follow-up visits. This information is not intended to replace advice given to you by your health care provider. Make sure you discuss any questions you have with your health care provider. Document Revised: 11/10/2020 Document Reviewed: 11/10/2020 Elsevier Patient Education  2024 Else[employer] Inc. Chlamydia, Female  Chlamydia is a sexually transmitted infection (STI). This infection spreads through sexual contact. Chlamydia can occur in different areas of the body, including: The urethra. This is the part of the body that drains urine from the bladder. The cervix. This is the lowest part of the uterus. The throat. The rectum. This condition is not difficult to treat. However, if left untreated, chlamydia can lead to more serious health  problems, including pelvic inflammatory disease (PID). PID can increase your risk of being unable to have children. In pregnant women, untreated chlamydia can cause serious complications during pregnancy or health problems for the baby after delivery. What are the causes? This condition is caused by a bacteria called Chlamydia trachomatis. The bacteria are spread from an infected partner during sexual activity. Chlamydia can spread through contact with the genitals, mouth, or rectum. What increases the risk? The following factors may make you more likely to develop this condition: Not using a condom the right way or not using a condom every time you have sex. Having a new sex partner or having more than one sex partner. Being sexually active before age 47. What are the signs or symptoms? In some cases, there are  no symptoms, especially early in the infection. If symptoms develop, they may include: Urinating often, or a burning feeling during urination. Redness, soreness, or swelling of the vagina or rectum. Discharge coming from the vagina or rectum. Pain in the abdomen. Pain during sex. Bleeding between menstrual periods or irregular periods. How is this diagnosed? This condition may be diagnosed with: Urine tests. Swab tests. Depending on your symptoms, your health care provider may use a cotton swab to collect a fluid sample from your vagina, rectum, nose, or throat to test for the bacteria. A pelvic exam. How is this treated? This condition is treated with antibiotic medicines. Follow these instructions at home: Sexual activity Tell your sex partner or partners about your infection. These include any partners for oral, anal, or vaginal sex that you have had within 60 days of when your symptoms started. Sex partners should also be treated, even if they have no signs of the infection. Do not have sex until you and your sex partners have completed treatment and your health care provider  says it is okay. If your health care provider prescribed you a single-dose medicine as treatment, wait at least 7 days after taking the medicine before having sex. General instructions Take over-the-counter and prescription medicines as told by your health care provider. Finish all antibiotic medicine even when you start to feel better. It is up to you to get your test results. Ask your health care provider, or the department that is doing the test, when your results will be ready. Keep all follow-up visits. This is important. You may need to be tested for infection again 3 months after treatment. How is this prevented? You can lower your risk of getting chlamydia by: Using latex or polyurethane condoms correctly every time you have sex. Not having multiple sex partners. Asking if your sex partner has been tested for STIs and had negative results. Getting regular health screenings to check for STIs. Contact a health care provider if: You develop new symptoms or your symptoms are getting worse. Your symptoms do not get better after treatment. You have a fever or chills. You have pain during sex. You have irregular menstrual periods, or you have bleeding between periods or after sex. You develop flu-like symptoms, such as night sweats, sore throat, or muscle aches. You are unable to take your antibiotic medicine as prescribed. Summary Chlamydia is a sexually transmitted infection (STI) that is caused by bacteria. This infection spreads through sexual contact. This condition is treated with antibiotic medicines. If left untreated, chlamydia can lead to more serious health problems, including pelvic inflammatory disease (PID). Your sex partners will also need to be treated. Do not have sex until both you and your partner have been treated. Take medicines as directed by your health care provider and keep all follow-up visits to ensure your infection has been completely treated. This information is  not intended to replace advice given to you by your health care provider. Make sure you discuss any questions you have with your health care provider. Document Revised: 10/10/2020 Document Reviewed: 10/10/2020 Elsevier Patient Education  2024 [employer].

## 2023-01-01 NOTE — Progress Notes (Deleted)
    GYNECOLOGY PROGRESS NOTE  Subjective:  PCP: [facility]  Patient ID: ARONDA BURFORD, female    DOB: 29-May-1990, 32 y.o.   MRN: [id number]  HPI  Patient is a 32 y.o. [id number] female who presents for   {Common ambulatory SmartLinks:19316}  Review of Systems {ros; complete:30496}   Objective:   unknown if currently breastfeeding. There is no height or weight on file to calculate BMI.  General appearance: {general exam:16600} Abdomen: {abdominal exam:16834} Pelvic: {pelvic exam:16852::cervix normal in appearance,external genitalia normal,no adnexal masses or tenderness,no cervical motion tenderness,rectovaginal septum normal,uterus normal size, shape, and consistency,vagina normal without discharge} Extremities: {extremity exam:5109} Neurologic: {neuro exam:17854}   Assessment/Plan:   No diagnosis found.   There are no diagnoses linked to this encounter.     Estil Mangle, DO [facility]

## 2023-01-03 ENCOUNTER — Telehealth: Payer: Self-pay | Admitting: Obstetrics

## 2023-01-03 ENCOUNTER — Telehealth: Payer: Self-pay | Admitting: Physical Therapy

## 2023-01-03 ENCOUNTER — Other Ambulatory Visit: Payer: Self-pay

## 2023-01-03 ENCOUNTER — Encounter: Payer: Medicaid Other | Admitting: Physical Therapy

## 2023-01-03 DIAGNOSIS — B379 Candidiasis, unspecified: Secondary | ICD-10-CM

## 2023-01-03 LAB — URINE CULTURE: Organism ID, Bacteria: NO GROWTH

## 2023-01-03 LAB — CERVICOVAGINAL ANCILLARY ONLY
Bacterial Vaginitis (gardnerella): NEGATIVE
Candida Glabrata: NEGATIVE
Candida Vaginitis: POSITIVE — AB
Chlamydia: NEGATIVE
Comment: NEGATIVE
Comment: NEGATIVE
Comment: NEGATIVE
Comment: NEGATIVE
Comment: NEGATIVE
Comment: NORMAL
Neisseria Gonorrhea: NEGATIVE
Trichomonas: NEGATIVE

## 2023-01-03 MED ORDER — FLUCONAZOLE 150 MG PO TABS: 150.0000 mg | ORAL_TABLET | Freq: Once | ORAL | 0 refills | Status: AC

## 2023-01-03 NOTE — Telephone Encounter (Signed)
 I contacted the patient via phone x2 no answer, no  voicemail. Message sent thru to patient's my chart.
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
## 2023-01-03 NOTE — Telephone Encounter (Signed)
 Called pt regarding missed appointment, was disconnected. Unable to LVM

## 2023-01-03 NOTE — Telephone Encounter (Addendum)
-----   Message from Cleda Daub sent at 01/01/2023  3:19 PM EST ----- Regarding: needs appointment This patient needs to rescheduled with ROBY. Patient is out of town 1/9-1/12. Morning only. Please contact patient for scheduling. CB # [phone]

## 2023-01-04 ENCOUNTER — Telehealth: Payer: Self-pay

## 2023-01-04 MED ORDER — FLUCONAZOLE 150 MG PO TABS: 150.0000 mg | ORAL_TABLET | Freq: Once | ORAL | 0 refills | Status: AC

## 2023-01-04 NOTE — Progress Notes (Signed)
 Spoke with patient. She is aware of results and will proceed with medication.

## 2023-01-04 NOTE — Telephone Encounter (Signed)
 Attempted to reach patient to advice her of results of cervicovaginal swab, will send in prescription today to [facility] in [place] to cover yeast infection. Message was sent to patients mychart to contact office back. KW

## 2023-01-09 ENCOUNTER — Other Ambulatory Visit: Payer: Self-pay

## 2023-01-09 DIAGNOSIS — B379 Candidiasis, unspecified: Secondary | ICD-10-CM

## 2023-01-09 MED ORDER — FLUCONAZOLE 150 MG PO TABS: 150.0000 mg | ORAL_TABLET | Freq: Every day | ORAL | 0 refills | Status: DC

## 2023-01-10 ENCOUNTER — Ambulatory Visit: Payer: Medicaid Other | Admitting: Obstetrics

## 2023-01-11 NOTE — Progress Notes (Signed)
    GYNECOLOGY PROGRESS NOTE  Subjective:  PCP: [facility]  Patient ID: Carolyn Dawson, female    DOB: 1990/01/02, 33 y.o.   MRN: [id number]  HPI  Patient is a 33 y.o. [phone] female who presents for vaginal itching and burning for 3 weeks. Had vaginal swab on 12/31, positive for yeast and neg for GC/CT/TV and BV. She took Diflucan  x 1, but states the itching persists and is having discharge. No intercourse or new partners since Oct '24.  The following portions of the patient's history were reviewed and updated as appropriate: allergies, current medications, past family history, past medical history, past social history, past surgical history, and problem list.  Review of Systems Pertinent items are noted in HPI.   Objective:   Blood pressure 114/63, pulse 75, height 5' 2 (1.575 m), weight 123 lb (55.8 kg), last menstrual period 01/01/2023. Body mass index is 22.5 kg/m.  General appearance: alert and cooperative Abdomen: soft, non-tender; bowel sounds normal; no masses,  no organomegaly Pelvic: cervix normal in appearance, external genitalia normal, no adnexal masses or tenderness, no cervical motion tenderness, rectovaginal septum normal, uterus normal size, shape, and consistency, and white discharge in vaginal canal. Extremities: extremities normal, atraumatic, no cyanosis or edema Neurologic: Grossly normal   Assessment/Plan:   1. Vaginal itching   2. Vaginal discharge   Most likely incompletely treated vaginal candidiasis. Offered further doses of Diflucan , pt prefers to wait for swab results. Retested for BV and yeast today, since 12/31 swab negative for STIs and no new partners since.    Estil Mangle, DO [facility]

## 2023-01-14 ENCOUNTER — Other Ambulatory Visit: Payer: Self-pay | Admitting: Physician Assistant

## 2023-01-14 ENCOUNTER — Encounter: Payer: Self-pay | Admitting: Obstetrics

## 2023-01-14 ENCOUNTER — Other Ambulatory Visit: Payer: Self-pay | Admitting: Obstetrics

## 2023-01-14 ENCOUNTER — Other Ambulatory Visit (HOSPITAL_COMMUNITY)
Admission: RE | Admit: 2023-01-14 | Discharge: 2023-01-14 | Disposition: A | Discharge status: Home / Self Care | Payer: Medicaid Other | Source: Ambulatory Visit | Attending: Obstetrics | Admitting: Obstetrics

## 2023-01-14 ENCOUNTER — Ambulatory Visit: Payer: Medicaid Other | Admitting: Obstetrics

## 2023-01-14 VITALS — BP 114/63 | HR 75 | Ht 62.0 in | Wt 123.0 lb

## 2023-01-14 DIAGNOSIS — N939 Abnormal uterine and vaginal bleeding, unspecified: Secondary | ICD-10-CM

## 2023-01-14 DIAGNOSIS — N898 Other specified noninflammatory disorders of vagina: Secondary | ICD-10-CM | POA: Diagnosis present

## 2023-01-14 DIAGNOSIS — B379 Candidiasis, unspecified: Secondary | ICD-10-CM

## 2023-01-14 MED ORDER — FLUCONAZOLE 150 MG PO TABS: 150.0000 mg | ORAL_TABLET | Freq: Once | ORAL | 0 refills | Status: AC

## 2023-01-16 LAB — CERVICOVAGINAL ANCILLARY ONLY
Bacterial Vaginitis (gardnerella): NEGATIVE
Candida Glabrata: NEGATIVE
Candida Vaginitis: NEGATIVE
Comment: NEGATIVE
Comment: NEGATIVE
Comment: NEGATIVE

## 2023-01-19 ENCOUNTER — Encounter: Payer: Self-pay | Admitting: Obstetrics

## 2023-10-23 ENCOUNTER — Ambulatory Visit (INDEPENDENT_AMBULATORY_CARE_PROVIDER_SITE_OTHER)

## 2023-10-23 VITALS — BP 120/72 | HR 76 | Ht 62.0 in | Wt 120.2 lb

## 2023-10-23 DIAGNOSIS — N912 Amenorrhea, unspecified: Secondary | ICD-10-CM | POA: Diagnosis not present

## 2023-10-23 DIAGNOSIS — Z3201 Encounter for pregnancy test, result positive: Secondary | ICD-10-CM | POA: Diagnosis not present

## 2023-10-23 DIAGNOSIS — N926 Irregular menstruation, unspecified: Secondary | ICD-10-CM

## 2023-10-23 LAB — POCT URINE PREGNANCY: Preg Test, Ur: POSITIVE — AB

## 2023-10-23 NOTE — Progress Notes (Addendum)
    NURSE VISIT NOTE  Subjective:    Patient ID: Carolyn Dawson, female    DOB: [birth year], 33 y.o.   MRN: [id number]  HPI  Patient is a 33 y.o. [phone] female who presents for evaluation of amenorrhea. She believes she could be pregnant. Pregnancy is not desired. Sexual Activity: single partner, contraception: none. Current symptoms also include: breast tenderness. Last period was normal.    Objective:    BP 120/72 (BP Location: Left Arm, Patient Position: Sitting, Cuff Size: Normal)   Pulse 76   Ht 5' 2 (1.575 m)   Wt 120 lb 3.2 oz (54.5 kg)   LMP 09/17/2023 (Exact Date)   BMI 21.98 kg/m     Assessment:   1. Missed period     Plan:   Pregnancy Test: Positive  Estimated Date of Delivery: 06/23/2024 BP Cuff Measurement taken. Cuff Size Adult Small Encouraged well-balanced diet, plenty of rest when needed, pre-natal vitamins daily and walking for exercise.      Burnard LITTIE Ro, CMA

## 2023-10-25 ENCOUNTER — Encounter: Payer: Self-pay | Admitting: Certified Nurse Midwife

## 2023-11-05 ENCOUNTER — Other Ambulatory Visit: Payer: Self-pay

## 2023-11-05 ENCOUNTER — Emergency Department

## 2023-11-05 ENCOUNTER — Emergency Department
Admission: EM | Admit: 2023-11-05 | Discharge: 2023-11-05 | Discharge status: Against Medical Advice | Attending: Emergency Medicine | Admitting: Emergency Medicine

## 2023-11-05 DIAGNOSIS — Z5329 Procedure and treatment not carried out because of patient's decision for other reasons: Secondary | ICD-10-CM | POA: Diagnosis not present

## 2023-11-05 DIAGNOSIS — R0789 Other chest pain: Secondary | ICD-10-CM | POA: Insufficient documentation

## 2023-11-05 DIAGNOSIS — R1031 Right lower quadrant pain: Secondary | ICD-10-CM | POA: Insufficient documentation

## 2023-11-05 DIAGNOSIS — Z3A01 Less than 8 weeks gestation of pregnancy: Secondary | ICD-10-CM | POA: Insufficient documentation

## 2023-11-05 DIAGNOSIS — R109 Unspecified abdominal pain: Secondary | ICD-10-CM

## 2023-11-05 DIAGNOSIS — R110 Nausea: Secondary | ICD-10-CM | POA: Diagnosis not present

## 2023-11-05 DIAGNOSIS — O26891 Other specified pregnancy related conditions, first trimester: Secondary | ICD-10-CM | POA: Insufficient documentation

## 2023-11-05 LAB — CBC
HCT: 36.2 % (ref 36.0–46.0)
Hemoglobin: 11.7 g/dL — ABNORMAL LOW (ref 12.0–15.0)
MCH: 28.3 pg (ref 26.0–34.0)
MCHC: 32.3 g/dL (ref 30.0–36.0)
MCV: 87.7 fL (ref 80.0–100.0)
Platelets: 162 K/uL (ref 150–400)
RBC: 4.13 MIL/uL (ref 3.87–5.11)
RDW: 12.5 % (ref 11.5–15.5)
WBC: 7.5 K/uL (ref 4.0–10.5)
nRBC: 0 % (ref 0.0–0.2)

## 2023-11-05 LAB — COMPREHENSIVE METABOLIC PANEL WITH GFR
ALT: 11 U/L (ref 0–44)
AST: 18 U/L (ref 15–41)
Albumin: 4.2 g/dL (ref 3.5–5.0)
Alkaline Phosphatase: 24 U/L — ABNORMAL LOW (ref 38–126)
Anion gap: 11 (ref 5–15)
BUN: 11 mg/dL (ref 6–20)
CO2: 24 mmol/L (ref 22–32)
Calcium: 9.2 mg/dL (ref 8.9–10.3)
Chloride: 103 mmol/L (ref 98–111)
Creatinine, Ser: 0.56 mg/dL (ref 0.44–1.00)
GFR, Estimated: >60 mL/min (ref >60)
Glucose, Bld: 87 mg/dL (ref 70–99)
Potassium: 3.6 mmol/L (ref 3.5–5.1)
Sodium: 138 mmol/L (ref 135–145)
Total Bilirubin: 0.7 mg/dL (ref 0.0–1.2)
Total Protein: 8 g/dL (ref 6.5–8.1)

## 2023-11-05 LAB — URINALYSIS, ROUTINE W REFLEX MICROSCOPIC
Bilirubin Urine: NEGATIVE
Glucose, UA: NEGATIVE mg/dL
Hgb urine dipstick: NEGATIVE
Ketones, ur: NEGATIVE mg/dL
Leukocytes,Ua: NEGATIVE
Nitrite: NEGATIVE
Protein, ur: NEGATIVE mg/dL
Specific Gravity, Urine: 1.021 (ref 1.005–1.030)
pH: 7 (ref 5.0–8.0)

## 2023-11-05 LAB — POC URINE PREG, ED: Preg Test, Ur: POSITIVE — AB

## 2023-11-05 LAB — LIPASE, BLOOD: Lipase: 58 U/L — ABNORMAL HIGH (ref 11–51)

## 2023-11-05 LAB — HCG, QUANTITATIVE, PREGNANCY: hCG, Beta Chain, Quant, S: 162707 m[IU]/mL — ABNORMAL HIGH (ref <5)

## 2023-11-05 NOTE — ED Notes (Signed)
Pt encouraged to give a urine sample.  

## 2023-11-05 NOTE — ED Notes (Signed)
 Pt stated that she can't wait much longer for her results and has to pick her children up from school. Provider made aware.

## 2023-11-05 NOTE — ED Provider Notes (Signed)
 [facility] Provider Note    Event Date/Time   First MD Initiated Contact with Patient 11/05/23 0820     (approximate)  History   Chief Complaint: Abdominal Pain  HPI  Carolyn Dawson is a 33 y.o. female with a past medical history of anemia, anxiety, bipolar, presents to the emergency department for lower abdominal pain.  According to the patient for the last 2 weeks she has been experiencing nausea as well as intermittent lower abdominal pain.  States the pain is mostly in the lower abdomen to the right lower part of the abdomen.  Patient states she took a pregnancy test recently that was positive, last menstrual period she believes was mid September.  No fever.  Patient also states at times she feels chest tightness which she describes as feeling of anxiety in the chest.  No vaginal bleeding.  Physical Exam   Triage Vital Signs: ED Triage Vitals  Encounter Vitals Group     BP 11/05/23 0819 116/79     Girls Systolic BP Percentile --      Girls Diastolic BP Percentile --      Boys Systolic BP Percentile --      Boys Diastolic BP Percentile --      Pulse Rate 11/05/23 0819 68     Resp 11/05/23 0819 16     Temp 11/05/23 0819 98.3 F (36.8 C)     Temp Source 11/05/23 0819 Oral     SpO2 11/05/23 0819 100 %     Weight 11/05/23 0815 122 lb (55.3 kg)     Height 11/05/23 0815 5' (1.524 m)     Head Circumference --      Peak Flow --      Pain Score 11/05/23 0813 5     Pain Loc --      Pain Education --      Exclude from Growth Chart --     Most recent vital signs: Vitals:   11/05/23 0819  BP: 116/79  Pulse: 68  Resp: 16  Temp: 98.3 F (36.8 C)  SpO2: 100%    General: Awake, no distress.  CV:  Good peripheral perfusion.  Regular rate and rhythm  Resp:  Normal effort.  Equal breath sounds bilaterally.  Abd:  No distention.  Soft, minimal suprapubic tenderness.  No rebound or guarding.  ED Results / Procedures / Treatments   EKG  EKG viewed  and interpreted by myself shows sinus bradycardia 59 bpm the narrow QRS, normal axis, normal intervals, no concerning ST changes.  RADIOLOGY  Ultrasound has resulted showing 7-week 4-day intrauterine pregnancy with a heart rate of 152 bpm.   MEDICATIONS ORDERED IN ED: Medications - No data to display   IMPRESSION / MDM / ASSESSMENT AND PLAN / ED COURSE  I reviewed the triage vital signs and the nursing notes.  Patient's presentation is most consistent with acute presentation with potential threat to life or bodily function.  Patient presents the emergency department for intermittent lower abdominal pain as well as nausea and occasional tightness in the chest.  Overall the patient appears well, reassuring exam with just minimal suprapubic tenderness.  Reassuring vital signs.  Given the possibility of pregnancy we will check labs including a quantitative beta-hCG.  Will obtain a ultrasound.  Given her intermittent anxiety in the chest we will also obtain an EKG although the patient denies any chest pain at this time.   Lab work and ultrasound pending.  Patient's workup  is reassuring with a normal CBC normal chemistry lipase slightly elevated.  Urine pregnancy is positive.  Urinalysis is normal.  Beta-hCG resulted at 162,000.  Ultrasound finally resulted showing a 7-week 4-day intrauterine pregnancy.  Patient had to leave before ultrasound results were known as she states she had to pick up her children.  I attempted to call her on her listed cell phone numbers without any response.  FINAL CLINICAL IMPRESSION(S) / ED DIAGNOSES   Abdominal pain during pregnancy   Note:  This document was prepared using Dragon voice recognition software and may include unintentional dictation errors.   Dorothyann Drivers, MD 11/05/23 1258

## 2023-11-05 NOTE — ED Triage Notes (Signed)
 Pt to ED for abdominal pain and nausea for about 2 days and states she is pregnant but does not know how far along. States pain is sharp and constant to RLQ. States yesterday had loose stool that looked like red clay. States last period was around 1 month ago. Had (+) home preg test about 2 weeks ago. Denies bleeding. Hx 1 cesarean.

## 2023-11-07 NOTE — Progress Notes (Signed)
 New OB Intake  I connected with  Carolyn Dawson on 11/07/23 at  9:15 AM EST by MyChart Video Visit and verified that I am speaking with the correct person using two identifiers. Nurse is located at [facility] and pt is located at home.  I discussed the limitations, risks, security and privacy concerns of performing an evaluation and management service by telephone and the availability of in person appointments. I also discussed with the patient that there may be a patient responsible charge related to this service. The patient expressed understanding and agreed to proceed.  I explained I am completing New OB Intake today. We discussed her EDD of 06/09/2024 that is based on ultrasound  on 11/05/2023. Pt is G5/P3. I reviewed her allergies, medications, Medical/Surgical/OB history, and appropriate screenings. There are cats in the home: no. . Based on history, this is a/an pregnancy uncomplicated . Her obstetrical history is significant for smoker.  Patient Active Problem List   Diagnosis Date Noted   Miscarriage at 8 to [redacted] weeks gestation 10/01/2022   Positive depression screening 02/06/2019   Normal labor 12/08/2018   Vaginal birth after cesarean 12/08/2018   Postpartum care following vaginal delivery 12/08/2018   Trichomoniasis 04/30/2018   Previous cesarean section complicating pregnancy 04/30/2018   Supervision of high risk pregnancy, antepartum 04/16/2018    Concerns addressed today:   Delivery Plans:  Plans to deliver at [facility].  Anatomy US  Explained first scheduled US  will be 10 weeks. Anatomy US  will be scheduled around [redacted] weeks gestational age.  Labs Discussed genetic screening with patient. Patient will be getting genetic testing to be drawn at new OB visit. Discussed possible labs to be drawn at new OB appointment.  COVID Vaccine Patient has not had COVID vaccine.   Social Determinants of Health Food Insecurity: expresses food insecurity. Information  given on local food banks. WIC Referral: Patient is interested in referral to [facility].  Transportation: Patient expressed transportation needs. Childcare: Discussed no children allowed at ultrasound appointments.   First visit review I reviewed new OB appt with pt. I explained she will have blood work and pap smear/pelvic exam if indicated. Explained pt will be seen by Harlene Cisco  at first visit 12/12/23 ; encounter routed to appropriate provider.   Carolyn Dawson, CMA 11/07/2023  2:32 PM

## 2023-11-07 NOTE — Patient Instructions (Signed)
 First Trimester of Pregnancy  The first trimester of pregnancy starts on the first day of your last monthly period until the end of week 13. This is months 1 through 3 of pregnancy. A week after a sperm fertilizes an egg, the egg will implant into the wall of the uterus and begin to develop into a baby. Body changes during your first trimester Your body goes through many changes during pregnancy. The changes usually return to normal after your baby is born. Physical changes Your breasts may grow larger and may hurt. The area around your nipples may get darker. Your periods will stop. Your hair and nails may grow faster. You may pee more often. Health changes You may tire easily. Your gums may bleed and may be sensitive when you brush and floss. You may not feel hungry. You may have heartburn. You may throw up or feel like you may throw up. You may want to eat some foods, but not others. You may have headaches. You may have trouble pooping (constipation). Other changes Your emotions may change from day to day. You may have more dreams. Follow these instructions at home: Medicines Talk to your health care provider if you're taking medicines. Ask if the medicines are safe to take during pregnancy. Your provider may change the medicines that you take. Do not take any medicines unless told to by your provider. Take a prenatal vitamin that has at least 600 micrograms (mcg) of folic acid. Do not use herbal medicines, illegal substances, or medicines that are not approved by your provider. Eating and drinking While you're pregnant your body needs extra food for your growing baby. Talk with your provider about what to eat while pregnant. Activity Most women are able to exercise during pregnancy. Exercises may need to change as your pregnancy goes on. Talk to your provider about your activities and exercise routines. Relieving pain and discomfort Wear a good, supportive bra if your breasts  hurt. Rest with your legs raised if you have leg cramps or low back pain. Safety Wear your seatbelt at all times when you're in a car. Talk to your provider if someone hits you, hurts you, or yells at you. Talk with your provider if you're feeling sad or have thoughts of hurting yourself. Lifestyle Certain things can be harmful while you're pregnant. Follow these rules: Do not use hot tubs, steam rooms, or saunas. Do not douche. Do not use tampons or scented pads. Do not drink alcohol,smoke, vape, or use products with nicotine  or tobacco in them. If you need help quitting, talk with your provider. Avoid cat litter boxes and soil used by cats. These things carry germs that can cause harm to your pregnancy and your baby. General instructions Keep all follow-up visits. It helps you and your unborn baby stay as healthy as possible. Write down your questions. Take them to your visits. Your provider will: Talk with you about your overall health. Give you advice or refer you to specialists who can help with different needs, including: Prenatal education classes. Mental health and counseling. Foods and healthy eating. Ask for help if you need help with food. Call your dentist and ask to be seen. Brush your teeth with a soft toothbrush. Floss gently. Where to find more information American Pregnancy Association: americanpregnancy.org [employer]necologists: acog.org Office on [employer] Health: TravelLesson.ca Contact a health care provider if: You feel dizzy, faint, or have a fever. You vomit or have watery poop (diarrhea) for 2  days or more. You have abnormal discharge or bleeding from your vagina. You have pain when you pee or your pee smells bad. You have cramps, pain, or pressure in your belly area. Get help right away if: You have trouble breathing or chest pain. You have any kind of injury, such as from a fall or a car crash. These symptoms may be an  emergency. Get help right away. Call 911. Do not wait to see if the symptoms will go away. Do not drive yourself to the hospital. This information is not intended to replace advice given to you by your health care provider. Make sure you discuss any questions you have with your health care provider. Document Revised: 09/20/2022 Document Reviewed: 04/20/2022 Elsevier Patient Education  2024 Elsevier Inc.   Commonly Asked Questions During Pregnancy  Cats: A parasite can be excreted in cat feces.  To avoid exposure you need to have another person empty the little box.  If you must empty the litter box you will need to wear gloves.  Wash your hands after handling your cat.  This parasite can also be found in raw or undercooked meat so this should also be avoided.  Colds, Sore Throats, Flu: Please check your medication sheet to see what you can take for symptoms.  If your symptoms are unrelieved by these medications please call the office.  Dental Work: Most any dental work Agricultural consultant recommends is permitted.  X-rays should only be taken during the first trimester if absolutely necessary.  Your abdomen should be shielded with a lead apron during all x-rays.  Please notify your provider prior to receiving any x-rays.  Novocaine is fine; gas is not recommended.  If your dentist requires a note from us  prior to dental work please call the office and we will provide one for you.  Exercise: Exercise is an important part of staying healthy during your pregnancy.  You may continue most exercises you were accustomed to prior to pregnancy.  Later in your pregnancy you will most likely notice you have difficulty with activities requiring balance like riding a bicycle.  It is important that you listen to your body and avoid activities that put you at a higher risk of falling.  Adequate rest and staying well hydrated are a must!  If you have questions about the safety of specific activities ask your provider.     Exposure to Children with illness: Try to avoid obvious exposure; report any symptoms to us  when noted,  If you have chicken pos, red measles or mumps, you should be immune to these diseases.   Please do not take any vaccines while pregnant unless you have checked with your OB provider.  Fetal Movement: After 28 weeks we recommend you do kick counts twice daily.  Lie or sit down in a calm quiet environment and count your baby movements kicks.  You should feel your baby at least 10 times per hour.  If you have not felt 10 kicks within the first hour get up, walk around and have something sweet to eat or drink then repeat for an additional hour.  If count remains less than 10 per hour notify your provider.  Fumigating: Follow your pest control agent's advice as to how long to stay out of your home.  Ventilate the area well before re-entering.  Hemorrhoids:   Most over-the-counter preparations can be used during pregnancy.  Check your medication to see what is safe to use.  It is important  to use a stool softener or fiber in your diet and to drink lots of liquids.  If hemorrhoids seem to be getting worse please call the office.   Hot Tubs:  Hot tubs Jacuzzis and saunas are not recommended while pregnant.  These increase your internal body temperature and should be avoided.  Intercourse:  Sexual intercourse is safe during pregnancy as long as you are comfortable, unless otherwise advised by your provider.  Spotting may occur after intercourse; report any bright red bleeding that is heavier than spotting.  Labor:  If you know that you are in labor, please go to the hospital.  If you are unsure, please call the office and let us  help you decide what to do.  Lifting, straining, etc:  If your job requires heavy lifting or straining please check with your provider for any limitations.  Generally, you should not lift items heavier than that you can lift simply with your hands and arms (no back  muscles)  Painting:  Paint fumes do not harm your pregnancy, but may make you ill and should be avoided if possible.  Latex or water  based paints have less odor than oils.  Use adequate ventilation while painting.  Permanents & Hair Color:  Chemicals in hair dyes are not recommended as they cause increase hair dryness which can increase hair loss during pregnancy.   Highlighting and permanents are allowed.  Dye may be absorbed differently and permanents may not hold as well during pregnancy.  Sunbathing:  Use a sunscreen, as skin burns easily during pregnancy.  Drink plenty of fluids; avoid over heating.  Tanning Beds:  Because their possible side effects are still unknown, tanning beds are not recommended.  Ultrasound Scans:  Routine ultrasounds are performed at approximately 20 weeks.  You will be able to see your baby's general anatomy an if you would like to know the gender this can usually be determined as well.  If it is questionable when you conceived you may also receive an ultrasound early in your pregnancy for dating purposes.  Otherwise ultrasound exams are not routinely performed unless there is a medical necessity.  Although you can request a scan we ask that you pay for it when conducted because insurance does not cover  patient request scans.  Work: If your pregnancy proceeds without complications you may work until your due date, unless your physician or employer advises otherwise.  Round Ligament Pain/Pelvic Discomfort:  Sharp, shooting pains not associated with bleeding are fairly common, usually occurring in the second trimester of pregnancy.  They tend to be worse when standing up or when you remain standing for long periods of time.  These are the result of pressure of certain pelvic ligaments called round ligaments.  Rest, Tylenol  and heat seem to be the most effective relief.  As the womb and fetus grow, they rise out of the pelvis and the discomfort improves.  Please  notify the office if your pain seems different than that described.  It may represent a more serious condition.  Common Medications Safe in Pregnancy  Acne:      Constipation:  Benzoyl Peroxide     Colace  Clindamycin      Dulcolax Suppository  Topica Erythromycin     Fibercon  Salicylic Acid      Metamucil         Miralax AVOID:        Senakot   Accutane    Cough:  Retin-A  Cough Drops  Tetracycline      Phenergan w/ Codeine if Rx  Minocycline      Robitussin (Plain & DM)  Antibiotics:     Crabs/Lice:  Ceclor       RID  Cephalosporins    AVOID:  E-Mycins      Kwell  Keflex  Macrobid/Macrodantin   Diarrhea:  Penicillin      Kao-Pectate  Zithromax      Imodium AD         PUSH FLUIDS AVOID:       Cipro     Fever:  Tetracycline      Tylenol  (Regular or Extra  Minocycline       Strength)  Levaquin      Extra Strength-Do not          Exceed 8 tabs/24 hrs Caffeine:        200mg /day (equiv. To 1 cup of coffee or  approx. 3 12 oz sodas)         Gas: Cold/Hayfever:       Gas-X  Benadryl       Mylicon  Claritin       Phazyme  **Claritin-D        Chlor-Trimeton    Headaches:  Dimetapp      ASA-Free Excedrin  Drixoral-Non-Drowsy     Cold Compress  Mucinex (Guaifenasin)     Tylenol  (Regular or Extra  Sudafed/Sudafed-12 Hour     Strength)  **Sudafed PE Pseudoephedrine   Tylenol  Cold & Sinus     Vicks Vapor Rub  Zyrtec  **AVOID if Problems With Blood Pressure         Heartburn: Avoid lying down for at least 1 hour after meals  Aciphex      Maalox     Rash:  Milk of Magnesia     Benadryl     Mylanta       1% Hydrocortisone Cream  Pepcid   Pepcid  Complete   Sleep Aids:  Prevacid      Ambien    Prilosec       Benadryl   Rolaids       Chamomile Tea  Tums (Limit 4/day)     Unisom         Tylenol  PM         Warm milk-add vanilla or  Hemorrhoids:       Sugar for taste  Anusol/Anusol H.C.  (RX: Analapram 2.5%)  Sugar Substitutes:  Hydrocortisone OTC     Ok in  moderation  Preparation H      Tucks        Vaseline lotion applied to tissue with wiping    Herpes:     Throat:  Acyclovir      Oragel  Famvir  Valtrex     Vaccines:         Flu Shot Leg Cramps:       *Gardasil  Benadryl       Hepatitis A         Hepatitis B Nasal Spray:       Pneumovax  Saline Nasal Spray     Polio Booster         Tetanus Nausea:       Tuberculosis test or PPD  Vitamin B6 25 mg TID   AVOID:    Dramamine      *Gardasil  Emetrol       Live Poliovirus  Ginger Root 250 mg QID    MMR (measles, mumps &  High Complex Carbs @ Bedtime    rebella)  Sea Bands-Accupressure    Varicella (Chickenpox)  Unisom 1/2 tab TID     *No known complications           If received before Pain:         Known pregnancy;   Darvocet       Resume series after  Lortab        Delivery  Percocet    Yeast:   Tramadol      Femstat  Tylenol  3      Gyne-lotrimin  Ultram       Monistat  Vicodin           MISC:         All Sunscreens           Hair Coloring/highlights          Insect Repellant's          (Including DEET)         Mystic Tans  Tests and Screening During Pregnancy Tests and screenings during pregnancy are an important part of your prenatal care. These tests help your health care provider find any problems that might affect your pregnancy. Some tests need to be done for all pregnant people, and some are optional. Most of the tests and screenings do not pose any risks for you or your baby. You may need more testing if a test result shows there is a risk to your health or your baby's health. Tests and screenings done early in pregnancy Some tests and screenings you may have in early pregnancy are: Blood tests, such as: Complete blood count (CBC). Blood typing. Tests to check for diseases that can cause birth defects or can be passed to your baby, such as: [place] measles (rubella( and chicken pox. Hepatitis B and C. Human Immunodeficiency Virus (HIV). Syphilis. Zika  virus. Pee tests. Blood pressure. Testing for sexually transmitted infections (STIs), such as chlamydia or gonorrhea. Testing for tuberculosis. Ultrasound. Tests and screenings done later in pregnancy Some common tests you can expect to have later in pregnancy include: Rh antibody testing. Pee and blood tests. Glucose screening. This checks your blood sugar. It will show whether you are developing the type of diabetes that happens during pregnancy, called gestational diabetes. You may have this screening earlier if you have risk factors for diabetes. Ultrasound. This may be repeated at 16-20 weeks to check how your baby is growing. Screening for group B streptococcus (GBS). GBS is a type of bacteria that may live in your rectum or vagina. GBS can spread to your baby during birth. This test is done at 35-37 weeks of pregnancy. Non-stress test. This may be done more often if your pregnancy is high risk. Biophysical profile. This test includes ultrasound imaging and a non-stress test to check to see if your baby is healthy. This test may help decide when your baby should be born. Screening for birth defects Early in your pregnancy, tests can be done to find out if your baby is at risk for a genetic disorder. This testing is optional. The type of testing recommended for you will depend on your family and medical history, your ethnicity, and your age. Testing may include: Screening tests such as ultrasound, blood tests, or a combination of both. Carrier screening. If genetic screening shows that your baby is at risk for a genetic defect, diagnostic testing may be recommended, such as: Amniocentesis. Chorionic villus sampling. Unlike other tests done  during pregnancy, diagnostic testing does have some risk for your pregnancy. Talk to your provider about the risks and benefits of genetic testing. Questions to ask your health care provider What tests are recommended for me? When and how will these  tests be done? When will I get the results of the tests? What do the results of these tests mean for me or my baby? Do you recommend any genetic screening tests? Which ones? Should I see a genetic counselor before having genetic screening? Where to find more information Go to americanpregnancy.org Click on search. Type 'prenatal tests in the search box. Go to TravelLesson.ca Click on search. Type 'prenatal tests in the search box. Go to acog.org Click on search. Type routine tests in the search box. This information is not intended to replace advice given to you by your health care provider. Make sure you discuss any questions you have with your health care provider. Document Revised: 10/16/2022 Document Reviewed: 10/16/2022 Elsevier Patient Education  2025 [employer].

## 2023-11-08 ENCOUNTER — Telehealth: Payer: Self-pay

## 2023-11-08 ENCOUNTER — Telehealth (INDEPENDENT_AMBULATORY_CARE_PROVIDER_SITE_OTHER)

## 2023-11-08 ENCOUNTER — Other Ambulatory Visit: Payer: Self-pay | Admitting: Registered Nurse

## 2023-11-08 DIAGNOSIS — Z3480 Encounter for supervision of other normal pregnancy, unspecified trimester: Secondary | ICD-10-CM | POA: Insufficient documentation

## 2023-11-08 DIAGNOSIS — O3680X0 Pregnancy with inconclusive fetal viability, not applicable or unspecified: Secondary | ICD-10-CM

## 2023-11-08 DIAGNOSIS — O219 Vomiting of pregnancy, unspecified: Secondary | ICD-10-CM

## 2023-11-08 DIAGNOSIS — Z3689 Encounter for other specified antenatal screening: Secondary | ICD-10-CM

## 2023-11-08 MED ORDER — ONDANSETRON 4 MG PO TBDP: 4.0000 mg | ORAL_TABLET | Freq: Four times a day (QID) | ORAL | 1 refills | Status: DC | PRN

## 2023-11-08 MED ORDER — PROMETHAZINE HCL 25 MG RE SUPP: 25.0000 mg | Freq: Four times a day (QID) | RECTAL | 1 refills | Status: DC | PRN

## 2023-11-08 NOTE — Telephone Encounter (Signed)
 Patient was seen today for NOB intake and had stated immediate need and help with housing, food and transportation. Social Drivers of Health was reviewed with patient it appears that utilities are in threat of being shut off, difficulty paying rent/mortgage in current home and she stated that she was previously staying in hotel. If you would please out to patient and assist with services available. Thank You.

## 2023-11-08 NOTE — Telephone Encounter (Signed)
 Patient was seen for mychart NOB intake today and I have placed a future U/S order in chart. Last U/S done 11/05/23 recommended follow up. Please contact patient and schedule. Thank You

## 2023-11-08 NOTE — Telephone Encounter (Signed)
 Patient was contacted today for NOB intake, she had stated during visit that she has been experiencing severe nauseas with this pregnancy and reports decreased appetite. Patient is asking a prescription to help with symptoms sent to [facility] in [place]. Patient states that she has been trying to drink Pediasure since she has decreased appetite. Please advise. KW

## 2023-11-18 ENCOUNTER — Telehealth: Payer: Self-pay | Admitting: Licensed Clinical Social Worker

## 2023-11-18 NOTE — Telephone Encounter (Signed)
-----   Message from Andriette DELENA Lambing sent at 11/18/2023 10:19 AM EST ----- Regarding: Mental Health Referral Good morning,  I received this member who is having a lot going on right now. Her mother recently had a stroke and is unable to talk/speak. Mother is currently in the hospital. Member has been diagnosed with anxiety, depression, and bipolar with no medication. She agreed to a referral for her mental health. Please reach out to this member.  Thanks for all that you do! Jenna Brackman

## 2023-11-20 ENCOUNTER — Other Ambulatory Visit

## 2023-11-20 DIAGNOSIS — Z3A09 9 weeks gestation of pregnancy: Secondary | ICD-10-CM

## 2023-11-20 DIAGNOSIS — O3680X0 Pregnancy with inconclusive fetal viability, not applicable or unspecified: Secondary | ICD-10-CM | POA: Diagnosis not present

## 2023-11-20 DIAGNOSIS — Z3480 Encounter for supervision of other normal pregnancy, unspecified trimester: Secondary | ICD-10-CM

## 2023-11-26 ENCOUNTER — Emergency Department
Admission: EM | Admit: 2023-11-26 | Discharge: 2023-11-26 | Disposition: A | Discharge status: Home / Self Care | Attending: Emergency Medicine | Admitting: Emergency Medicine

## 2023-11-26 ENCOUNTER — Other Ambulatory Visit: Payer: Self-pay

## 2023-11-26 DIAGNOSIS — O219 Vomiting of pregnancy, unspecified: Secondary | ICD-10-CM | POA: Insufficient documentation

## 2023-11-26 DIAGNOSIS — Z3A10 10 weeks gestation of pregnancy: Secondary | ICD-10-CM | POA: Diagnosis not present

## 2023-11-26 DIAGNOSIS — R059 Cough, unspecified: Secondary | ICD-10-CM | POA: Insufficient documentation

## 2023-11-26 DIAGNOSIS — O26891 Other specified pregnancy related conditions, first trimester: Secondary | ICD-10-CM | POA: Insufficient documentation

## 2023-11-26 LAB — COMPREHENSIVE METABOLIC PANEL WITH GFR
ALT: 7 U/L (ref 0–44)
AST: 19 U/L (ref 15–41)
Albumin: 4.2 g/dL (ref 3.5–5.0)
Alkaline Phosphatase: 34 U/L — ABNORMAL LOW (ref 38–126)
Anion gap: 10 (ref 5–15)
BUN: 9 mg/dL (ref 6–20)
CO2: 24 mmol/L (ref 22–32)
Calcium: 9.5 mg/dL (ref 8.9–10.3)
Chloride: 103 mmol/L (ref 98–111)
Creatinine, Ser: 0.55 mg/dL (ref 0.44–1.00)
GFR, Estimated: >60 mL/min (ref >60)
Glucose, Bld: 76 mg/dL (ref 70–99)
Potassium: 3.5 mmol/L (ref 3.5–5.1)
Sodium: 138 mmol/L (ref 135–145)
Total Bilirubin: 0.3 mg/dL (ref 0.0–1.2)
Total Protein: 7.5 g/dL (ref 6.5–8.1)

## 2023-11-26 LAB — CBC
HCT: 33.4 % — ABNORMAL LOW (ref 36.0–46.0)
Hemoglobin: 10.9 g/dL — ABNORMAL LOW (ref 12.0–15.0)
MCH: 27.8 pg (ref 26.0–34.0)
MCHC: 32.6 g/dL (ref 30.0–36.0)
MCV: 85.2 fL (ref 80.0–100.0)
Platelets: 213 K/uL (ref 150–400)
RBC: 3.92 MIL/uL (ref 3.87–5.11)
RDW: 12 % (ref 11.5–15.5)
WBC: 7.8 K/uL (ref 4.0–10.5)
nRBC: 0 % (ref 0.0–0.2)

## 2023-11-26 LAB — RESP PANEL BY RT-PCR (RSV, FLU A&B, COVID)  RVPGX2
Influenza A by PCR: NEGATIVE
Influenza B by PCR: NEGATIVE
Resp Syncytial Virus by PCR: NEGATIVE
SARS Coronavirus 2 by RT PCR: NEGATIVE

## 2023-11-26 LAB — URINALYSIS, ROUTINE W REFLEX MICROSCOPIC
Bilirubin Urine: NEGATIVE
Glucose, UA: NEGATIVE mg/dL
Hgb urine dipstick: NEGATIVE
Ketones, ur: NEGATIVE mg/dL
Leukocytes,Ua: NEGATIVE
Nitrite: NEGATIVE
Protein, ur: NEGATIVE mg/dL
Specific Gravity, Urine: 1.018 (ref 1.005–1.030)
pH: 7 (ref 5.0–8.0)

## 2023-11-26 LAB — LIPASE, BLOOD: Lipase: 19 U/L (ref 11–51)

## 2023-11-26 LAB — POC URINE PREG, ED: Preg Test, Ur: POSITIVE — AB

## 2023-11-26 MED ORDER — SODIUM CHLORIDE 0.9 % IV BOLUS
1000.0000 mL | Freq: Once | INTRAVENOUS | Status: AC
  Administered 2023-11-26: 1000 mL via INTRAVENOUS

## 2023-11-26 MED ORDER — VITAMIN B-6 25 MG PO TABS: 25.0000 mg | ORAL_TABLET | Freq: Every day | ORAL | 0 refills | Status: AC

## 2023-11-26 MED ORDER — PYRIDOXINE HCL 25 MG PO TABS
25.0000 mg | ORAL_TABLET | Freq: Every day | ORAL | Status: DC
  Administered 2023-11-26: 25 mg via ORAL
  Filled 2023-11-26: qty 1

## 2023-11-26 NOTE — Discharge Instructions (Signed)
 Please follow-up with your OB/GYN.  Please return for any new, worsening, or changing symptoms or other concerns.  You may take the medications as prescribed.  It was a pleasure caring for you today.

## 2023-11-26 NOTE — ED Triage Notes (Signed)
 C/O vomiting, ongoing, but worse since 1500 yesterday.  G5 P3  EDC: 06/20/2023  Sees [facility]  States unable to keep anything down.  aAOx3. Skin warm and dry. NAD

## 2023-11-26 NOTE — ED Provider Notes (Signed)
 [facility] Provider Note    Event Date/Time   First MD Initiated Contact with Patient 11/26/23 1108     (approximate)   History   Emesis During Pregnancy   HPI  RYELEE Dawson is a 33 y.o. female G3 P1 currently 10+[redacted] weeks pregnant by ultrasound who presents today for evaluation of nausea and vomiting.  Patient reports that she has had nausea and vomiting for the past 5 weeks.  No abdominal pain or diarrhea.  She also reports that she has new cough.  No fevers or chills.  Patient Active Problem List   Diagnosis Date Noted   Supervision of other normal pregnancy, antepartum 11/08/2023   Miscarriage at 8 to [redacted] weeks gestation 10/01/2022   Positive depression screening 02/06/2019   Normal labor 12/08/2018   Vaginal birth after cesarean 12/08/2018   Postpartum care following vaginal delivery 12/08/2018   Trichomoniasis 04/30/2018   Previous cesarean section complicating pregnancy 04/30/2018   Supervision of high risk pregnancy, antepartum 04/16/2018          Physical Exam   Triage Vital Signs: ED Triage Vitals [11/26/23 1048]  Encounter Vitals Group     BP 110/81     Girls Systolic BP Percentile      Girls Diastolic BP Percentile      Boys Systolic BP Percentile      Boys Diastolic BP Percentile      Pulse Rate 90     Resp 16     Temp 98 F (36.7 C)     Temp Source Oral     SpO2 100 %     Weight 121 lb 4.1 oz (55 kg)     Height      Head Circumference      Peak Flow      Pain Score 0     Pain Loc      Pain Education      Exclude from Growth Chart     Most recent vital signs: Vitals:   11/26/23 1048  BP: 110/81  Pulse: 90  Resp: 16  Temp: 98 F (36.7 C)  SpO2: 100%    Physical Exam Vitals and nursing note reviewed.  Constitutional:      General: Awake and alert. No acute distress.    Appearance: Normal appearance. The patient is normal weight.  HENT:     Head: Normocephalic and atraumatic.     Mouth: Mucous membranes are  moist.  Eyes:     General: PERRL. Normal EOMs        Right eye: No discharge.        Left eye: No discharge.     Conjunctiva/sclera: Conjunctivae normal.  Cardiovascular:     Rate and Rhythm: Normal rate and regular rhythm.     Pulses: Normal pulses.  Pulmonary:     Effort: Pulmonary effort is normal. No respiratory distress.     Breath sounds: Normal breath sounds.  Abdominal:     Abdomen is soft. There is no abdominal tenderness. No rebound or guarding. No distention. Musculoskeletal:        General: No swelling. Normal range of motion.     Cervical back: Normal range of motion and neck supple.  Skin:    General: Skin is warm and dry.     Capillary Refill: Capillary refill takes less than 2 seconds.     Findings: No rash.  Neurological:     Mental Status: The patient is awake and alert.  ED Results / Procedures / Treatments   Labs (all labs ordered are listed, but only abnormal results are displayed) Labs Reviewed  COMPREHENSIVE METABOLIC PANEL WITH GFR - Abnormal; Notable for the following components:      Result Value   Alkaline Phosphatase 34 (*)    All other components within normal limits  CBC - Abnormal; Notable for the following components:   Hemoglobin 10.9 (*)    HCT 33.4 (*)    All other components within normal limits  URINALYSIS, ROUTINE W REFLEX MICROSCOPIC - Abnormal; Notable for the following components:   Color, Urine YELLOW (*)    APPearance HAZY (*)    All other components within normal limits  POC URINE PREG, ED - Abnormal; Notable for the following components:   Preg Test, Ur POSITIVE (*)    All other components within normal limits  RESP PANEL BY RT-PCR (RSV, FLU A&B, COVID)  RVPGX2  LIPASE, BLOOD     EKG     RADIOLOGY     PROCEDURES:  Critical Care performed:   Procedures   MEDICATIONS ORDERED IN ED: Medications  pyridOXINE  (VITAMIN B6) tablet 25 mg (25 mg Oral Given 11/26/23 1232)  sodium chloride  0.9 % bolus 1,000 mL  (0 mLs Intravenous Stopped 11/26/23 1343)     IMPRESSION / MDM / ASSESSMENT AND PLAN / ED COURSE  I reviewed the triage vital signs and the nursing notes.   Differential diagnosis includes, but is not limited to, nausea and vomiting in pregnancy, hyperemesis gravidarum, electrolyte disarray, dehydration.  I reviewed the patient's chart.  Patient had an ultrasound on 11/20/2023 which revealed a intrauterine pregnancy of 9 weeks and 5 days.  She was found to have a subchorionic hemorrhage as well.  Patient is awake and alert, hemodynamically stable and afebrile.  She is nontoxic in appearance.  Her abdomen is soft and nontender throughout, no vaginal discharge or bleeding, and recent pelvic ultrasound with IUP, do not feel that she needs a repeat ultrasound today which patient is in agreement with.  Labs are overall reassuring.  She was treated with IV fluids and pyridoxine  with good effect.  She is able to tolerate p.o. reports that she feels significantly improved.  Urinalysis is not suggestive of infection.  No ketones in her urine.  She is appropriate for outpatient follow-up at this time.  She was given a prescription for the pyridoxine  per her request.  She understands return precautions which we discussed at length.  She was discharged in stable condition.   Patient's presentation is most consistent with acute complicated illness / injury requiring diagnostic workup.   Clinical Course as of 11/26/23 1522  Tue Nov 26, 2023  1326 Patient reports that she feels significantly improved.  Able to tolerate p.o. [JP]    Clinical Course User Index [JP] Joseandres Mazer E, PA-C     FINAL CLINICAL IMPRESSION(S) / ED DIAGNOSES   Final diagnoses:  Nausea and vomiting in pregnancy     Rx / DC Orders   ED Discharge Orders          Ordered    pyridOXINE  (VITAMIN B6) 25 MG tablet  Daily        11/26/23 1335             Note:  This document was prepared using Dragon voice recognition  software and may include unintentional dictation errors.   Vernadine Coombs E, PA-C 11/26/23 1523    Jacolyn Pae, MD 11/26/23 1537

## 2023-12-11 NOTE — Patient Instructions (Signed)
 Second Trimester of Pregnancy  The second trimester of pregnancy is from week 14 through week 27. This is months 4 through 6 of pregnancy. During the second trimester: Morning sickness is less or has stopped. You may have more energy. You may feel hungry more often. At this time, your unborn baby is growing very fast. At the end of the sixth month, the unborn baby may be up to 12 inches long and weigh about 1 pounds. You will likely start to feel the baby move between 16 and 20 weeks of pregnancy. Body changes during your second trimester Your body continues to change during this time. The changes usually go away after your baby is born. Physical changes You will gain more weight. Your belly will get bigger. You may begin to get stretch marks on your hips, belly, and breasts. Your breasts will keep growing and may hurt. You may get dark spots or blotches on your face. A dark line from your belly button to the pubic area may appear. This line is called linea nigra. Your hair may grow faster and get thicker. Health changes You may have headaches. You may have heartburn. You may pee more often. You may have swollen, bulging veins (varicose veins). You may have trouble pooping (constipation), or swollen veins in the butt that can itch or get painful (hemorrhoids). You may have back pain. This is caused by: Weight gain. Pregnancy hormones that are relaxing the joints in your pelvis. Follow these instructions at home: Medicines Talk to your health care provider if you're taking medicines. Ask if the medicines are safe to take during pregnancy. Your provider may change the medicines that you take. Do not take any medicines unless told to by your provider. Take a prenatal vitamin that has at least 600 micrograms (mcg) of folic acid. Do not use herbal medicines, illegal drugs, or medicines that are not approved by your provider. Eating and drinking While you're pregnant your body needs  extra food for your growing baby. Talk with your provider about what to eat while pregnant. Activity Most women are able to exercise during pregnancy. Exercises may need to change as your pregnancy goes on. Talk to your provider about your activities and exercise routines. Relieving pain and discomfort Wear a good, supportive bra if your breasts hurt. Rest with your legs raised if you have leg cramps or low back pain. Take warm sitz baths to soothe pain from hemorrhoids. Use hemorrhoid cream if your provider says it's okay. Do not douche. Do not use tampons or scented pads. Do not use hot tubs, steam rooms, or saunas. Safety Wear your seatbelt at all times when you're in a car. Talk to your provider if someone hits you, hurts you, or yells at you. Talk with your provider if you're feeling sad or have thoughts of hurting yourself. Lifestyle Certain things can be harmful while you're pregnant. It's best to avoid the following: Do not drink alcohol,smoke, vape, or use products with nicotine or tobacco in them. If you need help quitting, talk with your provider. Avoid cat litter boxes and soil used by cats. These things carry germs that can cause harm to your pregnancy and your baby. General instructions Keep all follow-up visits. It helps you and your unborn baby stay as healthy as possible. Write down your questions. Take them to your prenatal visits. Your provider will: Talk with you about your overall health. Give you advice or refer you to specialists who can help with different needs,  including: Prenatal education classes. Mental health and counseling. Foods and healthy eating. Ask for help if you need help with food. Where to find more information American Pregnancy Association: americanpregnancy.org [employer]: acog.org Office on [employer] Health: [employer] Contact a health care provider if: You have a headache that does not go away  when you take medicine. You have any of these problems: You can't eat or drink. You throw up or feel like you may throw up. You have watery poop (diarrhea) for 2 days or more. You have pain when you pee or your pee smells bad. You have been sick for 2 days or more and are not getting better. Contact your provider right away if: You have any of these coming from your vagina: Abnormal discharge. Bad-smelling fluid. Bleeding. Your baby is moving less than usual. You have contractions, belly cramping, or have pain in your pelvis or lower back. You have symptoms of high blood pressure or preeclampsia. These include: A severe, throbbing headache that does not go away. Sudden or extreme swelling of your face, hands, legs, or feet. Vision problems: You see spots. You have blurry vision. Your eyes are sensitive to light. If you can't reach the provider, go to an urgent care or emergency room. Get help right away if: You faint, become confused, or can't think clearly. You have chest pain or trouble breathing. You have any kind of injury, such as from a fall or a car crash. These symptoms may be an emergency. Call 911 right away. Do not wait to see if the symptoms will go away. Do not drive yourself to the hospital. This information is not intended to replace advice given to you by your health care provider. Make sure you discuss any questions you have with your health care provider. Document Revised: 09/20/2022 Document Reviewed: 04/20/2022 Elsevier Patient Education  2024 [employer].
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
## 2023-12-12 ENCOUNTER — Ambulatory Visit: Admitting: Certified Nurse Midwife

## 2023-12-12 ENCOUNTER — Other Ambulatory Visit (HOSPITAL_COMMUNITY)
Admission: RE | Admit: 2023-12-12 | Discharge: 2023-12-12 | Disposition: A | Discharge status: Home / Self Care | Source: Ambulatory Visit | Attending: Certified Nurse Midwife | Admitting: Certified Nurse Midwife

## 2023-12-12 VITALS — BP 108/63 | HR 89 | Wt 121.0 lb

## 2023-12-12 DIAGNOSIS — T7589XA Other specified effects of external causes, initial encounter: Secondary | ICD-10-CM

## 2023-12-12 DIAGNOSIS — Z1151 Encounter for screening for human papillomavirus (HPV): Secondary | ICD-10-CM

## 2023-12-12 DIAGNOSIS — Z3A12 12 weeks gestation of pregnancy: Secondary | ICD-10-CM | POA: Diagnosis not present

## 2023-12-12 DIAGNOSIS — Z0283 Encounter for blood-alcohol and blood-drug test: Secondary | ICD-10-CM

## 2023-12-12 DIAGNOSIS — Z862 Personal history of diseases of the blood and blood-forming organs and certain disorders involving the immune mechanism: Secondary | ICD-10-CM

## 2023-12-12 DIAGNOSIS — Z3481 Encounter for supervision of other normal pregnancy, first trimester: Secondary | ICD-10-CM

## 2023-12-12 DIAGNOSIS — Z369 Encounter for antenatal screening, unspecified: Secondary | ICD-10-CM

## 2023-12-12 DIAGNOSIS — Z131 Encounter for screening for diabetes mellitus: Secondary | ICD-10-CM

## 2023-12-12 DIAGNOSIS — Z124 Encounter for screening for malignant neoplasm of cervix: Secondary | ICD-10-CM

## 2023-12-12 DIAGNOSIS — Z114 Encounter for screening for human immunodeficiency virus [HIV]: Secondary | ICD-10-CM

## 2023-12-12 DIAGNOSIS — Z113 Encounter for screening for infections with a predominantly sexual mode of transmission: Secondary | ICD-10-CM

## 2023-12-12 DIAGNOSIS — Z130 Encounter for screening for diseases of the blood and blood-forming organs and certain disorders involving the immune mechanism: Secondary | ICD-10-CM

## 2023-12-12 DIAGNOSIS — O34219 Maternal care for unspecified type scar from previous cesarean delivery: Secondary | ICD-10-CM

## 2023-12-12 DIAGNOSIS — Z0184 Encounter for antibody response examination: Secondary | ICD-10-CM

## 2023-12-12 DIAGNOSIS — Z1379 Encounter for other screening for genetic and chromosomal anomalies: Secondary | ICD-10-CM

## 2023-12-12 MED ORDER — DOXYLAMINE-PYRIDOXINE 10-10 MG PO TBEC: 2.0000 | DELAYED_RELEASE_TABLET | Freq: Every day | ORAL | 5 refills | Status: AC

## 2023-12-13 ENCOUNTER — Ambulatory Visit: Payer: Self-pay | Admitting: Certified Nurse Midwife

## 2023-12-13 ENCOUNTER — Encounter: Payer: Self-pay | Admitting: Certified Nurse Midwife

## 2023-12-13 DIAGNOSIS — Z91410 Personal history of adult physical and sexual abuse: Secondary | ICD-10-CM | POA: Insufficient documentation

## 2023-12-13 LAB — CBC/D/PLT+RPR+RH+ABO+RUBIGG...
Antibody Screen: NEGATIVE
Basophils Absolute: 0 x10E3/uL (ref 0.0–0.2)
Basos: 0 %
EOS (ABSOLUTE): 0 x10E3/uL (ref 0.0–0.4)
Eos: 1 %
HCV Ab: NONREACTIVE
HIV Screen 4th Generation wRfx: NONREACTIVE
Hematocrit: 31.8 % — ABNORMAL LOW (ref 34.0–46.6)
Hemoglobin: 9.9 g/dL — ABNORMAL LOW (ref 11.1–15.9)
Hepatitis B Surface Ag: NEGATIVE
Immature Grans (Abs): 0 x10E3/uL (ref 0.0–0.1)
Immature Granulocytes: 0 %
Lymphocytes Absolute: 1.4 x10E3/uL (ref 0.7–3.1)
Lymphs: 26 %
MCH: 27.8 pg (ref 26.6–33.0)
MCHC: 31.1 g/dL — ABNORMAL LOW (ref 31.5–35.7)
MCV: 89 fL (ref 79–97)
Monocytes Absolute: 0.4 x10E3/uL (ref 0.1–0.9)
Monocytes: 8 %
Neutrophils Absolute: 3.3 x10E3/uL (ref 1.4–7.0)
Neutrophils: 65 %
Platelets: 181 x10E3/uL (ref 150–450)
RBC: 3.56 x10E6/uL — ABNORMAL LOW (ref 3.77–5.28)
RDW: 12.2 % (ref 11.7–15.4)
RPR Ser Ql: NONREACTIVE
Rh Factor: POSITIVE
Rubella Antibodies, IGG: 1.92 {index} (ref >0.99)
Varicella zoster IgG: REACTIVE
WBC: 5.1 x10E3/uL (ref 3.4–10.8)

## 2023-12-13 LAB — IRON,TIBC AND FERRITIN PANEL
Ferritin: 88 ng/mL (ref 15–150)
Iron Saturation: 29 % (ref 15–55)
Iron: 86 ug/dL (ref 27–159)
Total Iron Binding Capacity: 297 ug/dL (ref 250–450)
UIBC: 211 ug/dL (ref 131–425)

## 2023-12-13 LAB — URINALYSIS, ROUTINE W REFLEX MICROSCOPIC
Bilirubin, UA: NEGATIVE
Glucose, UA: NEGATIVE
Ketones, UA: NEGATIVE
Leukocytes,UA: NEGATIVE
Nitrite, UA: NEGATIVE
Protein,UA: NEGATIVE
RBC, UA: NEGATIVE
Specific Gravity, UA: 1.027 (ref 1.005–1.030)
Urobilinogen, Ur: 1 mg/dL (ref 0.2–1.0)
pH, UA: 6.5 (ref 5.0–7.5)

## 2023-12-13 LAB — HEMOGLOBIN A1C
Est. average glucose Bld gHb Est-mCnc: 97 mg/dL
Hgb A1c MFr Bld: 5 % (ref 4.8–5.6)

## 2023-12-13 LAB — CYTOLOGY - PAP
Comment: NEGATIVE
Diagnosis: NEGATIVE
High risk HPV: NEGATIVE

## 2023-12-13 LAB — HCV INTERPRETATION

## 2023-12-13 NOTE — Assessment & Plan Note (Signed)
 Desires VBAC. Reviewed waterbirth is not possible at [facility].

## 2023-12-14 LAB — CULTURE, OB URINE

## 2023-12-14 LAB — B12 AND FOLATE PANEL
Folate: 12.6 ng/mL (ref >3.0)
Vitamin B-12: 450 pg/mL (ref 232–1245)

## 2023-12-14 LAB — URINE CULTURE, OB REFLEX

## 2023-12-14 LAB — SPECIMEN STATUS REPORT

## 2023-12-16 ENCOUNTER — Other Ambulatory Visit: Payer: Self-pay | Admitting: Certified Nurse Midwife

## 2023-12-16 MED ORDER — FERROUS SULFATE 325 (65 FE) MG PO TABS: 325.0000 mg | ORAL_TABLET | ORAL | 3 refills | Status: AC

## 2023-12-16 MED ORDER — DOCUSATE SODIUM 100 MG PO CAPS: 100.0000 mg | ORAL_CAPSULE | Freq: Two times a day (BID) | ORAL | 4 refills | Status: AC

## 2023-12-17 LAB — CERVICOVAGINAL ANCILLARY ONLY
Bacterial Vaginitis (gardnerella): POSITIVE — AB
Chlamydia: NEGATIVE
Comment: NEGATIVE
Comment: NEGATIVE
Comment: NEGATIVE
Comment: NEGATIVE
Comment: NEGATIVE
Comment: NORMAL
Neisseria Gonorrhea: NEGATIVE

## 2023-12-17 LAB — MONITOR DRUG PROFILE 14(MW)
Amphetamine Scrn, Ur: NEGATIVE ng/mL
BARBITURATE SCREEN URINE: NEGATIVE ng/mL
BENZODIAZEPINE SCREEN, URINE: NEGATIVE ng/mL
Buprenorphine, Urine: NEGATIVE ng/mL
Cocaine (Metab) Scrn, Ur: NEGATIVE ng/mL
Creatinine(Crt), U: 131.7 mg/dL (ref 20.0–300.0)
Fentanyl, Urine: NEGATIVE pg/mL
Meperidine Screen, Urine: NEGATIVE ng/mL
Methadone Screen, Urine: NEGATIVE ng/mL
OXYCODONE+OXYMORPHONE UR QL SCN: NEGATIVE ng/mL
Opiate Scrn, Ur: NEGATIVE ng/mL
Ph of Urine: 6.2 (ref 4.5–8.9)
Phencyclidine Qn, Ur: NEGATIVE ng/mL
Propoxyphene Scrn, Ur: NEGATIVE ng/mL
SPECIFIC GRAVITY: 1.023
Tramadol Screen, Urine: NEGATIVE ng/mL

## 2023-12-17 LAB — CANNABINOID (GC/MS), URINE
Cannabinoid: POSITIVE — AB
Carboxy THC (GC/MS): 91 ng/mL

## 2023-12-18 LAB — PANORAMA PRENATAL TEST FULL PANEL:PANORAMA TEST PLUS 5 ADDITIONAL MICRODELETIONS: FETAL FRACTION: 17

## 2023-12-23 NOTE — Telephone Encounter (Signed)
 Spoke to pt via phone.  She would like to reschedule for 12/31 to be seen sooner.  Appt made with front desk.  Discussed comfort measures.  She will report to urgent care if she cannot wait until next week.

## 2023-12-24 NOTE — Telephone Encounter (Signed)
 SABRA

## 2023-12-30 NOTE — Progress Notes (Deleted)
" ° ° °  Return Prenatal Note   Subjective   33 y.o. [id number] at [redacted]w[redacted]d presents for this follow-up prenatal visit.  Patient *** Patient reports:    Objective   Flow sheet Vitals:   Total weight gain: 1 lb (0.454 kg)  General Appearance  No acute distress, well appearing, and well nourished Pulmonary   Normal work of breathing Neurologic   Alert and oriented to person, place, and time Psychiatric   Mood and affect within normal limits   Assessment/Plan   Plan  33 y.o. [id number] at [redacted]w[redacted]d presents for follow-up OB visit. Reviewed prenatal record including previous visit note.  No problem-specific Assessment & Plan notes found for this encounter.      No orders of the defined types were placed in this encounter.  No follow-ups on file.   Future Appointments  Date Time Provider Department Center  01/01/2024 10:35 AM Dominic, Jinnie Jansky, CNM [facility] None    For next visit:  {[email or web address]:[id number]}     Burnard LITTIE Ro, CMA  12/29/20254:26 PM  "

## 2024-01-01 ENCOUNTER — Encounter: Admitting: Licensed Practical Nurse

## 2024-01-01 DIAGNOSIS — Z3A15 15 weeks gestation of pregnancy: Secondary | ICD-10-CM

## 2024-01-01 DIAGNOSIS — Z3482 Encounter for supervision of other normal pregnancy, second trimester: Secondary | ICD-10-CM

## 2024-01-08 ENCOUNTER — Encounter: Payer: Self-pay | Admitting: Registered Nurse

## 2024-01-08 DIAGNOSIS — Z3A16 16 weeks gestation of pregnancy: Secondary | ICD-10-CM | POA: Insufficient documentation

## 2024-01-08 NOTE — Progress Notes (Signed)
" ° ° °  Return Prenatal Note   Subjective   34 y.o. [id number] at [redacted]w[redacted]d presents for this follow-up prenatal visit.  Patient had testing showing BV, but was never treated. She's still having milky, thin vaginal discharge. AFP Screening declines Patient reports: Pain knees, legs and top of abdomen ache only at night. Movement: Present Contractions: Not present  Objective   Flow sheet Vitals: Pulse Rate: (!) 108 BP: 100/61 Fundal Height: 16 cm Fetal Heart Rate (bpm): 135 Total weight gain: 13 lb 9.6 oz (6.169 kg)  General Appearance  No acute distress, well appearing, and well nourished Pulmonary   Normal work of breathing Neurologic   Alert and oriented to person, place, and time Psychiatric   Mood and affect within normal limits   Assessment/Plan   Plan  34 y.o. [id number] at [redacted]w[redacted]d presents for follow-up OB visit. Reviewed prenatal record including previous visit note.  [redacted] weeks gestation of pregnancy F/u 4 wks for ROB and anatomy scan.  Reviewed common discomforts of pregnancy as well as management strategies. Rx for metrogel  sent to pharmacy. Previously dx with BV, but not treated.      Orders Placed This Encounter  Procedures   US  OB Comp + 14 Wk    Standing Status:   Future    Expected Date:   02/08/2024    Expiration Date:   01/07/2025    Reason for Exam (SYMPTOM  OR DIAGNOSIS REQUIRED):   Fetal Anatomic Survey    Preferred Imaging Location?:   Internal   Return for ROB and anatomy scan.   No future appointments.   For next visit:  ROB and anatomy scan     Lauraine Lakes, [facility]  01/09/2024 8:52 AM  "

## 2024-01-09 ENCOUNTER — Encounter: Admitting: Registered Nurse

## 2024-01-09 ENCOUNTER — Ambulatory Visit: Admitting: Registered Nurse

## 2024-01-09 VITALS — BP 100/61 | HR 108 | Wt 133.6 lb

## 2024-01-09 DIAGNOSIS — Z3482 Encounter for supervision of other normal pregnancy, second trimester: Secondary | ICD-10-CM | POA: Diagnosis not present

## 2024-01-09 DIAGNOSIS — Z3481 Encounter for supervision of other normal pregnancy, first trimester: Secondary | ICD-10-CM

## 2024-01-09 DIAGNOSIS — Z3A16 16 weeks gestation of pregnancy: Secondary | ICD-10-CM | POA: Diagnosis not present

## 2024-01-09 DIAGNOSIS — B9689 Other specified bacterial agents as the cause of diseases classified elsewhere: Secondary | ICD-10-CM

## 2024-01-09 DIAGNOSIS — N898 Other specified noninflammatory disorders of vagina: Secondary | ICD-10-CM

## 2024-01-09 DIAGNOSIS — Z3689 Encounter for other specified antenatal screening: Secondary | ICD-10-CM

## 2024-01-09 DIAGNOSIS — Z3480 Encounter for supervision of other normal pregnancy, unspecified trimester: Secondary | ICD-10-CM

## 2024-01-09 DIAGNOSIS — O34219 Maternal care for unspecified type scar from previous cesarean delivery: Secondary | ICD-10-CM

## 2024-01-09 MED ORDER — METRONIDAZOLE 0.75 % VA GEL: 1.0000 | Freq: Every day | VAGINAL | 1 refills | Status: AC

## 2024-01-09 NOTE — Assessment & Plan Note (Signed)
 F/u 4 wks for ROB and anatomy scan.  Reviewed common discomforts of pregnancy as well as management strategies.

## 2024-01-09 NOTE — Patient Instructions (Signed)
 Second Trimester of Pregnancy  The second trimester of pregnancy is from week 14 through week 27. This is months 4 through 6 of pregnancy. During the second trimester: Morning sickness is less or has stopped. You may have more energy. You may feel hungry more often. At this time, your unborn baby is growing very fast. At the end of the sixth month, the unborn baby may be up to 12 inches long and weigh about 1 pounds. You will likely start to feel the baby move between 16 and 20 weeks of pregnancy. Body changes during your second trimester Your body continues to change during this time. The changes usually go away after your baby is born. Physical changes You will gain more weight. Your belly will get bigger. You may begin to get stretch marks on your hips, belly, and breasts. Your breasts will keep growing and may hurt. You may get dark spots or blotches on your face. A dark line from your belly button to the pubic area may appear. This line is called linea nigra. Your hair may grow faster and get thicker. Health changes You may have headaches. You may have heartburn. You may pee more often. You may have swollen, bulging veins (varicose veins). You may have trouble pooping (constipation), or swollen veins in the butt that can itch or get painful (hemorrhoids). You may have back pain. This is caused by: Weight gain. Pregnancy hormones that are relaxing the joints in your pelvis. Follow these instructions at home: Medicines Talk to your health care provider if you're taking medicines. Ask if the medicines are safe to take during pregnancy. Your provider may change the medicines that you take. Do not take any medicines unless told to by your provider. Take a prenatal vitamin that has at least 600 micrograms (mcg) of folic acid. Do not use herbal medicines, illegal drugs, or medicines that are not approved by your provider. Eating and drinking While you're pregnant your body needs  extra food for your growing baby. Talk with your provider about what to eat while pregnant. Activity Most women are able to exercise during pregnancy. Exercises may need to change as your pregnancy goes on. Talk to your provider about your activities and exercise routines. Relieving pain and discomfort Wear a good, supportive bra if your breasts hurt. Rest with your legs raised if you have leg cramps or low back pain. Take warm sitz baths to soothe pain from hemorrhoids. Use hemorrhoid cream if your provider says it's okay. Do not douche. Do not use tampons or scented pads. Do not use hot tubs, steam rooms, or saunas. Safety Wear your seatbelt at all times when you're in a car. Talk to your provider if someone hits you, hurts you, or yells at you. Talk with your provider if you're feeling sad or have thoughts of hurting yourself. Lifestyle Certain things can be harmful while you're pregnant. It's best to avoid the following: Do not drink alcohol,smoke, vape, or use products with nicotine  or tobacco in them. If you need help quitting, talk with your provider. Avoid cat litter boxes and soil used by cats. These things carry germs that can cause harm to your pregnancy and your baby. General instructions Keep all follow-up visits. It helps you and your unborn baby stay as healthy as possible. Write down your questions. Take them to your prenatal visits. Your provider will: Talk with you about your overall health. Give you advice or refer you to specialists who can help with different needs,  including: Prenatal education classes. Mental health and counseling. Foods and healthy eating. Ask for help if you need help with food. Where to find more information American Pregnancy Association: americanpregnancy.org [employer]: acog.org Office on [employer] Health: [employer] Contact a health care provider if: You have a headache that does not go away  when you take medicine. You have any of these problems: You can't eat or drink. You throw up or feel like you may throw up. You have watery poop (diarrhea) for 2 days or more. You have pain when you pee or your pee smells bad. You have been sick for 2 days or more and are not getting better. Contact your provider right away if: You have any of these coming from your vagina: Abnormal discharge. Bad-smelling fluid. Bleeding. Your baby is moving less than usual. You have contractions, belly cramping, or have pain in your pelvis or lower back. You have symptoms of high blood pressure or preeclampsia. These include: A severe, throbbing headache that does not go away. Sudden or extreme swelling of your face, hands, legs, or feet. Vision problems: You see spots. You have blurry vision. Your eyes are sensitive to light. If you can't reach the provider, go to an urgent care or emergency room. Get help right away if: You faint, become confused, or can't think clearly. You have chest pain or trouble breathing. You have any kind of injury, such as from a fall or a car crash. These symptoms may be an emergency. Call 911 right away. Do not wait to see if the symptoms will go away. Do not drive yourself to the hospital. This information is not intended to replace advice given to you by your health care provider. Make sure you discuss any questions you have with your health care provider. Document Revised: 09/20/2022 Document Reviewed: 04/20/2022 Elsevier Patient Education  2024 [employer]Alpha-Fetoprotein Test: What to Know Why am I having this test? The alpha-fetoprotein (AFP) test is a blood test used to show if a pregnant person is at risk of carrying a baby with a congenital condition. A congenital condition is a problem that a baby is born with. The AFP test can be combined with other tests to look for these problems in the unborn baby: Genetic problems. Problems with the brain or  spinal cord. Belly problems. This test is used to see if your baby is at risk for having a congenital condition. It doesn't show if your baby actually has the condition. More testing will be needed to know for sure. The AFP test may also be done for males or nonpregnant people to check for certain cancers. What is being tested? This test measures the amount of AFP in your blood. AFP is a protein that's normally found in your blood starting in the 10th week of pregnancy. The level of AFP is highest at 16-18 weeks of pregnancy. AFP testing can be done between 15 and 20 weeks of pregnancy, but 16 to 18 weeks of pregnancy is the best time to do it. Certain cancers can cause a high level of AFP in both males and females. What kind of sample is taken?  A blood sample is needed for this test. It's usually taken by putting a needle into a blood vessel. How are the results reported? Your results will be compared with normal results for this test. Each lab has its own range for what's normal. Most lab reports include the normal ranges for each test and a  note telling you if yours is high or low. Normal results for the AFP test tend to be: Adult: Less than 40 ng/mL or less than 40 mcg/L (SI units). Child younger than 1 year: Less than 30 ng/mL. If you're pregnant, the values will change based on how far along you are. What do the results mean? If you're pregnant and your results are lower than expected, it can mean that your due date isn't right or that your baby may have trisomy 61, also known as Down syndrome. If you're pregnant and your results are higher than expected, it may mean: You're pregnant with more than one baby. Your due date is not right. Your baby may have: A problem with the wall of the belly. A problem with the spinal cord or brain. Your baby isn't getting enough oxygen, or the heart rate is too fast or too slow. Your baby has died before being born. Results that are higher than  expected in males or nonpregnant females may indicate: Cancer. Liver cell death. Talk with your health care provider about what your results mean. AFP testing is not used alone. Other testing will be needed. Questions to ask your health care provider Ask your provider, or the department that's doing the test: When will my results be ready? How will I get my results? What other tests do I need? What are my next steps? This information is not intended to replace advice given to you by your health care provider. Make sure you discuss any questions you have with your health care provider. Document Revised: 10/31/2022 Document Reviewed: 10/31/2022 Elsevier Patient Education  2025 [employer].

## 2024-01-22 ENCOUNTER — Ambulatory Visit

## 2024-02-06 ENCOUNTER — Ambulatory Visit

## 2024-02-06 ENCOUNTER — Ambulatory Visit: Admitting: Licensed Practical Nurse

## 2024-02-06 ENCOUNTER — Encounter: Payer: Self-pay | Admitting: Licensed Practical Nurse

## 2024-02-06 VITALS — BP 123/67 | HR 88 | Wt 131.5 lb

## 2024-02-06 DIAGNOSIS — O219 Vomiting of pregnancy, unspecified: Secondary | ICD-10-CM

## 2024-02-06 DIAGNOSIS — Z3A20 20 weeks gestation of pregnancy: Secondary | ICD-10-CM

## 2024-02-06 DIAGNOSIS — Z3480 Encounter for supervision of other normal pregnancy, unspecified trimester: Secondary | ICD-10-CM

## 2024-02-06 DIAGNOSIS — Z3482 Encounter for supervision of other normal pregnancy, second trimester: Secondary | ICD-10-CM

## 2024-02-06 DIAGNOSIS — Z3689 Encounter for other specified antenatal screening: Secondary | ICD-10-CM

## 2024-02-06 DIAGNOSIS — O99019 Anemia complicating pregnancy, unspecified trimester: Secondary | ICD-10-CM | POA: Insufficient documentation

## 2024-02-06 DIAGNOSIS — O34219 Maternal care for unspecified type scar from previous cesarean delivery: Secondary | ICD-10-CM

## 2024-02-06 MED ORDER — ONDANSETRON 4 MG PO TBDP: 4.0000 mg | ORAL_TABLET | Freq: Four times a day (QID) | ORAL | 0 refills | Status: AC | PRN

## 2024-02-06 NOTE — Assessment & Plan Note (Signed)
-  Had anatomy US , needs to return for heart views -TWG 11lbs, lost 2 lbs since last visit. Zofran  prescribed today, encouraged nutrient dense smoothies and broth, eat what you can tolerate -may use Normal Saline spray

## 2024-02-06 NOTE — Assessment & Plan Note (Signed)
-  discussed using cast iron skillet and [employer] fish

## 2024-02-07 ENCOUNTER — Encounter: Payer: Self-pay | Admitting: Certified Nurse Midwife

## 2024-02-27 ENCOUNTER — Other Ambulatory Visit

## 2024-03-05 ENCOUNTER — Encounter: Admitting: Certified Nurse Midwife
